# Patient Record
Sex: Male | Born: 1965
Health system: Southern US, Community
[De-identification: ages and names within clinical notes are randomized; demographics above are authoritative.]

## PROBLEM LIST (undated history)

## (undated) ENCOUNTER — Emergency Department (HOSPITAL_COMMUNITY): Discharge status: Absent without leave | Payer: Self-pay | Source: Home / Self Care

## (undated) DIAGNOSIS — E7800 Pure hypercholesterolemia, unspecified: Secondary | ICD-10-CM

## (undated) DIAGNOSIS — T7840XA Allergy, unspecified, initial encounter: Secondary | ICD-10-CM

## (undated) DIAGNOSIS — I10 Essential (primary) hypertension: Secondary | ICD-10-CM

## (undated) DIAGNOSIS — K219 Gastro-esophageal reflux disease without esophagitis: Secondary | ICD-10-CM

## (undated) DIAGNOSIS — E119 Type 2 diabetes mellitus without complications: Secondary | ICD-10-CM

## (undated) HISTORY — DX: Allergy, unspecified, initial encounter: T78.40XA

## (undated) HISTORY — PX: CARDIAC CATHETERIZATION: SHX172

## (undated) HISTORY — DX: Type 2 diabetes mellitus without complications: E11.9

## (undated) HISTORY — DX: Gastro-esophageal reflux disease without esophagitis: K21.9

---

## 2008-07-05 ENCOUNTER — Emergency Department (HOSPITAL_COMMUNITY): Admission: EM | Admit: 2008-07-05 | Discharge: 2008-07-05 | Payer: Self-pay | Admitting: Emergency Medicine

## 2010-11-27 NOTE — Consult Note (Signed)
NAMEUZIEL, COVAULT              ACCOUNT NO.:  [id number]   MEDICAL RECORD NO.:  [id number]          PATIENT TYPE:  EMS   LOCATION:  [facility]                         FACILITY:  [facility]   PHYSICIAN:  Vesta Mixer, M.D. DATE OF BIRTH:  [birth year]   DATE OF CONSULTATION:  07/05/2008  DATE OF DISCHARGE:                                 CONSULTATION   Stephen Lucero is a 45 year old gentleman with a history of diastolic  heart failure, hypertension, and chest pains.  He presented to the  emergency room on July 05, 2008.  He was seen by one of the  emergency room staff, Dr. Rolm Baptise, who is a resident doctor.  We  were called and asked to see Stephen Lucero for further evaluation.   Stephen Lucero is a middle-aged gentleman with a history of hypertension.  I  have seen him in my office previously.  He has had some chest heaviness  and shortness of breath.  He has had chest pains off and on for years.  I saw him this past July.  He has had an echocardiogram which reveals  normal left ventricular systolic function.  He does have marked left  ventricular hypertrophy with evidence of diastolic dysfunction.  He has  had some episodes of chest discomfort for the past several months.  These episodes occur at various times.  They are not associated with  eating, drinking, change of position, or taking a deep breath.  He  occasionally has some, with exertion, especially if he is carrying a  heavy object.  He presented to the hospital today with episodes of chest  pain that have been going on off and on for many days.  He thinks that  they have just gotten worse through the day.  He has not had any chest  pain since he was originally evaluated here in the ER at 1 o'clock.  He  denies any syncope or presyncope.  He denies any PND or orthopnea.  He  denies any nausea or vomiting.  He has had some diaphoresis but not  associated with the chest pain.  All of his review of systems were  reviewed and are  otherwise negative.   CURRENT MEDICATIONS:  1. Carvedilol 6.25 mg twice a day.  2. Lisinopril 10 mg a day.  3. Hydrochlorothiazide 25 mg a day.  4. Potassium chloride 10 mEq a day.   He has no known drug allergies.   PAST MEDICAL HISTORY:  1. Hypertension.  2. Diastolic dysfunction.  3. History of chest pain.   SOCIAL HISTORY:  The patient does not smoke.  He drinks approximately 8-  12 beers each night.  The patient operates [employer].   FAMILY HISTORY:  Positive for cardiac disease.   REVIEW OF SYSTEMS:  As noted in the HPI.  All other systems were  reviewed and are negative.   PHYSICAL EXAMINATION:  GENERAL:  He is a middle-aged gentleman in no  acute distress.  He is currently pain free and appears very comfortable.  VITAL SIGNS: His blood pressure is 140/104 and his heart rate is  70.  HEENT:  He is normocephalic and atraumatic.  NECK:  Supple.  Carotids are 2+ without bruits.  There is no JVD.  No  thyromegaly.  LUNGS:  Clear.  BACK:  Nontender.  HEART:  Regular rate.  S1 and S2.  He has a soft systolic murmur.  ABDOMEN:  Good bowel sounds and is nontender.  EXTREMITIES:  There is no clubbing, cyanosis, or edema.  His pulses are  intact.  SKIN:  Warm and dry.  NEUROLOGIC:  Cranial nerves II through XII are intact.  His motor and  sensory functions are intact.  His gait is normal.   LABORATORY DATA:  His CPK-MB and troponin levels were normal.  His basic  laboratory data was normal.  His sodium is 136, potassium is 3.9,  chloride is 103, BUN is 12, and creatinine is 1.  His white blood cell  count is 8.4, hemoglobin is 16.1, and hematocrit is 47.9.   Stephen Lucero presents with some chest tightness has been occurring off and  on for the past several months.  He thinks it may be a little bit worse  now.  I do not get the idea that he has had acute worsening today.  He  is completely stable.  His EKG today reveals normal sinus rhythm with no  ST or T-wave  changes.  He is stable to leave the emergency room.  We  will double his carvedilol to 12.5 mg twice a day.  I have given him a  prescription for nitroglycerin to use as needed.  We will continue with  the hydrochlorothiazide, lisinopril, and potassium chloride.  I will see  him back in the office in a couple of weeks and we will arrange for  stress Cardiolite study at that time.  He has been instructed to call  the ER right away if he has any recurrent episodes of chest pain.   I have recommended that he greatly diminish his alcohol intake.  I have  suggested that he cut his alcohol intake to take no more than 6 beers a  night for now.  We will cut it further at a later time.  I have also  asked him to greatly diminish his salt intake, he eats a lot of fast  food and restriction of salt certainly will help his blood pressure.      Vesta Mixer, M.D.  Electronically Signed     PJN/MEDQ  D:  07/05/2008  T:  07/06/2008  Job:  [id number]   cc:   Evelena Peat, M.D.

## 2011-04-19 LAB — POCT I-STAT, CHEM 8
Calcium, Ion: 1.05 mmol/L — ABNORMAL LOW (ref 1.12–1.32)
Creatinine, Ser: 1.1 mg/dL (ref 0.4–1.5)
Glucose, Bld: 100 mg/dL — ABNORMAL HIGH (ref 70–99)
HCT: 49 % (ref 39.0–52.0)
Hemoglobin: 16.7 g/dL (ref 13.0–17.0)
Potassium: 3.9 mEq/L (ref 3.5–5.1)
Sodium: 136 mEq/L (ref 135–145)
TCO2: 27 mmol/L (ref 0–100)

## 2011-04-19 LAB — CBC
Platelets: 244 10*3/uL (ref 150–400)
WBC: 8.4 10*3/uL (ref 4.0–10.5)

## 2011-04-19 LAB — RAPID URINE DRUG SCREEN, HOSP PERFORMED
Benzodiazepines: POSITIVE — AB
Cocaine: NOT DETECTED
Opiates: NOT DETECTED
Tetrahydrocannabinol: NOT DETECTED

## 2011-04-19 LAB — BASIC METABOLIC PANEL
BUN: 10 mg/dL (ref 6–23)
Creatinine, Ser: 0.89 mg/dL (ref 0.4–1.5)
GFR calc Af Amer: >60 mL/min (ref >60)
GFR calc non Af Amer: >60 mL/min (ref >60)
Glucose, Bld: 99 mg/dL (ref 70–99)

## 2012-04-28 ENCOUNTER — Emergency Department (HOSPITAL_COMMUNITY): Payer: BC Managed Care – PPO

## 2012-04-28 ENCOUNTER — Encounter (HOSPITAL_COMMUNITY): Payer: Self-pay | Admitting: Emergency Medicine

## 2012-04-28 ENCOUNTER — Other Ambulatory Visit: Payer: Self-pay

## 2012-04-28 ENCOUNTER — Emergency Department (HOSPITAL_COMMUNITY)
Admission: EM | Admit: 2012-04-28 | Discharge: 2012-04-28 | Disposition: A | Discharge status: Home / Self Care | Payer: BC Managed Care – PPO | Attending: Emergency Medicine | Admitting: Emergency Medicine

## 2012-04-28 DIAGNOSIS — R42 Dizziness and giddiness: Secondary | ICD-10-CM | POA: Insufficient documentation

## 2012-04-28 DIAGNOSIS — I10 Essential (primary) hypertension: Secondary | ICD-10-CM | POA: Insufficient documentation

## 2012-04-28 DIAGNOSIS — R110 Nausea: Secondary | ICD-10-CM | POA: Insufficient documentation

## 2012-04-28 DIAGNOSIS — R0602 Shortness of breath: Secondary | ICD-10-CM | POA: Insufficient documentation

## 2012-04-28 DIAGNOSIS — R079 Chest pain, unspecified: Secondary | ICD-10-CM | POA: Insufficient documentation

## 2012-04-28 HISTORY — DX: Pure hypercholesterolemia, unspecified: E78.00

## 2012-04-28 HISTORY — DX: Essential (primary) hypertension: I10

## 2012-04-28 LAB — CBC WITH DIFFERENTIAL/PLATELET
Basophils Absolute: 0 10*3/uL (ref 0.0–0.1)
Basophils Relative: 0 % (ref 0–1)
Eosinophils Relative: 5 % (ref 0–5)
HCT: 45.7 % (ref 39.0–52.0)
Lymphocytes Relative: 20 % (ref 12–46)
MCHC: 35.7 g/dL (ref 30.0–36.0)
Monocytes Absolute: 0.6 10*3/uL (ref 0.1–1.0)
Neutro Abs: 5.5 10*3/uL (ref 1.7–7.7)
Platelets: 210 10*3/uL (ref 150–400)
RDW: 12.3 % (ref 11.5–15.5)
WBC: 8 10*3/uL (ref 4.0–10.5)

## 2012-04-28 LAB — BASIC METABOLIC PANEL
CO2: 32 mEq/L (ref 19–32)
Calcium: 10.5 mg/dL (ref 8.4–10.5)
Chloride: 97 mEq/L (ref 96–112)
Creatinine, Ser: 0.85 mg/dL (ref 0.50–1.35)
GFR calc Af Amer: >90 mL/min (ref >90)
Sodium: 136 mEq/L (ref 135–145)

## 2012-04-28 LAB — POCT I-STAT TROPONIN I: Troponin i, poc: 0 ng/mL (ref 0.00–0.08)

## 2012-04-28 MED ORDER — HYDROCHLOROTHIAZIDE 25 MG PO TABS
25.0000 mg | ORAL_TABLET | Freq: Once | ORAL | Status: AC
  Administered 2012-04-28: 25 mg via ORAL
  Filled 2012-04-28: qty 1

## 2012-04-28 MED ORDER — HYDROCHLOROTHIAZIDE 25 MG PO TABS: 25.0000 mg | ORAL_TABLET | Freq: Every day | ORAL | Status: DC

## 2012-04-28 MED ORDER — OMEPRAZOLE 20 MG PO CPDR: 20.0000 mg | DELAYED_RELEASE_CAPSULE | Freq: Every day | ORAL | Status: DC

## 2012-04-28 MED ORDER — FAMOTIDINE 20 MG PO TABS: 20.0000 mg | ORAL_TABLET | Freq: Two times a day (BID) | ORAL | Status: DC

## 2012-04-28 MED ORDER — LISINOPRIL 20 MG PO TABS
20.0000 mg | ORAL_TABLET | Freq: Once | ORAL | Status: AC
  Administered 2012-04-28: 20 mg via ORAL
  Filled 2012-04-28: qty 1

## 2012-04-28 MED ORDER — LISINOPRIL 20 MG PO TABS: 20.0000 mg | ORAL_TABLET | Freq: Every day | ORAL | Status: DC

## 2012-04-28 NOTE — ED Notes (Signed)
Re faxed request to [facility] for Medical Records.

## 2012-04-28 NOTE — ED Notes (Signed)
Per EMS: Pt c/o chest pain for past two weeks.  Began to get worse last night and called EMS this am.  BP 200/120, EMS gave ASA and 3 Nitro.    Per Pt: c/o chest pain for past 2 weeks,  Yesterday it became worse and he spoke to a [facility] physician who suggested he come to the ED for evaluation.  He states that this pain has been occuring on and over for the past few years.  States that at times he feels SOB with diaphoresis but denies N/V or dizziness.  The pain is located in the sternal region of the chest and pt denies any radiating pain. Nad noted at this time.

## 2012-04-28 NOTE — ED Notes (Signed)
Pt states that he has not been able to take his HTN medication for over a year.

## 2012-04-28 NOTE — ED Notes (Signed)
First contact with pt. Pt denies any complaints at this time

## 2012-04-28 NOTE — ED Provider Notes (Signed)
History     CSN: [id number]  Arrival date & time 04/28/12  0910   First MD Initiated Contact with Patient 04/28/12 [phone]      Chief Complaint  Patient presents with  . Chest Pain    (Consider location/radiation/quality/duration/timing/severity/associated sxs/prior treatment) HPI Comments: Stephen Lucero is a 46 y.o. Male who presents with complaint of chest pain. Pt states pain has been on and off for 6 hrs. States it comes and goes, nothing makes it better or worse. Last episode woke him up from sleep at 3am. States improved with asa and nitro. Pt states he was admitted 1ya at [facility], states that he had a stress test that was normal, and had cardiac cath which he also states did not show any abnormalities. Pt has been following up with PCP in [place], but states no one has been able to figure out why he is having this pain. Pt denies reflux like symptoms, denies cough, denies URI symptoms. Pain is pressure like, not pleuritic. Admits to tingling in hands and nausea when gets pain. States diaphoresis as well, but states at times he has sweats even if he is not having any pain. Stats also having dizziness and "black spots" in eyes at times. This also comes and goes.   The history is provided by the patient.    Past Medical History  Diagnosis Date  . Hypertension   . High cholesterol     Past Surgical History  Procedure Date  . Cardiac catheterization     No family history on file.  History  Substance Use Topics  . Smoking status: Never Smoker   . Smokeless tobacco: Not on file  . Alcohol Use: Yes     Occasionaly on weekends      Review of Systems  Constitutional: Negative for fever and chills.  HENT: Negative for neck pain and neck stiffness.   Eyes: Negative for visual disturbance.  Respiratory: Positive for chest tightness and shortness of breath. Negative for cough and wheezing.   Cardiovascular: Positive for chest pain. Negative for palpitations and leg  swelling.  Gastrointestinal: Positive for nausea. Negative for vomiting and abdominal pain.  Genitourinary: Negative for dysuria.  Musculoskeletal: Negative.   Skin: Negative.   Neurological: Positive for dizziness and light-headedness. Negative for weakness, numbness and headaches.  Hematological: Negative.   Psychiatric/Behavioral: Negative.     Allergies  Review of patient's allergies indicates no known allergies.  Home Medications  No current outpatient prescriptions on file.  BP 167/111  Pulse 77  Temp 98.8 F (37.1 C) (Oral)  Resp 18  SpO2 96%  Physical Exam  Nursing note and vitals reviewed. Constitutional: He appears well-developed and well-nourished. No distress.  HENT:  Head: Normocephalic.  Eyes: Conjunctivae normal are normal.  Neck: Neck supple.  Cardiovascular: Normal rate, regular rhythm and normal heart sounds.     ED Course  Procedures (including critical care time)  PT with chest pain, on and off for 6 years. Pt states negative cath and stress test at [facility] 1  Year ago. Pt is currently cp free. BP elevated, 167/111, pt has been off BP meds for a year because has not followed up. Pt is an alcohol drinker, wonder if could be related to gastritis. Will get labs, ECG, cxr.  Results for orders placed during the hospital encounter of 04/28/12  CBC WITH DIFFERENTIAL      Component Value Range   WBC 8.0  4.0 - 10.5 K/uL   RBC 5.23  4.22 -  5.81 MIL/uL   Hemoglobin 16.3  13.0 - 17.0 g/dL   HCT 62.1  30.8 - 65.7 %   MCV 87.4  78.0 - 100.0 fL   MCH 31.2  26.0 - 34.0 pg   MCHC 35.7  30.0 - 36.0 g/dL   RDW 84.6  96.2 - 95.2 %   Platelets 210  150 - 400 K/uL   Neutrophils Relative 68  43 - 77 %   Neutro Abs 5.5  1.7 - 7.7 K/uL   Lymphocytes Relative 20  12 - 46 %   Lymphs Abs 1.6  0.7 - 4.0 K/uL   Monocytes Relative 7  3 - 12 %   Monocytes Absolute 0.6  0.1 - 1.0 K/uL   Eosinophils Relative 5  0 - 5 %   Eosinophils Absolute 0.4  0.0 - 0.7 K/uL    Basophils Relative 0  0 - 1 %   Basophils Absolute 0.0  0.0 - 0.1 K/uL  BASIC METABOLIC PANEL      Component Value Range   Sodium 136  135 - 145 mEq/L   Potassium 4.1  3.5 - 5.1 mEq/L   Chloride 97  96 - 112 mEq/L   CO2 32  19 - 32 mEq/L   Glucose, Bld 111 (*) 70 - 99 mg/dL   BUN 10  6 - 23 mg/dL   Creatinine, Ser 8.41  0.50 - 1.35 mg/dL   Calcium 32.4  8.4 - 40.1 mg/dL   GFR calc non Af Amer >90  >90 mL/min   GFR calc Af Amer >90  >90 mL/min  POCT I-STAT TROPONIN I      Component Value Range   Troponin i, poc 0.00  0.00 - 0.08 ng/mL   Comment 3           POCT I-STAT TROPONIN I      Component Value Range   Troponin i, poc 0.00  0.00 - 0.08 ng/mL   Comment 3            Dg Chest 2 View  04/28/2012  *RADIOLOGY REPORT*  Clinical Data: Chest pain  CHEST - 2 VIEW  Comparison:  July 05, 2008  Findings:  Lungs clear.  Heart size and pulmonary vascularity are normal.  No adenopathy.  No bone lesions.  IMPRESSION: Lungs clear.   Original Report Authenticated By: Arvin Collard. WOODRUFF III, M.D.     2:56 PM I have requested and reviewed pt's hospitalization records from last year at [facility]. Pt with cardiac cath on 03/21/11 showing Normal EF at 60%, LM nl, LAD prox 50% stenosis, diag 50% stenosis, LCX and RCA normal. Pt's troponins here are negative x2. Discussed with Dr. Preston Fleeting. Pt with stable symptoms, plan to d/c home with follow up with [facility] or cardiologists at [facility]. Pt voiced understanding. Will start on prilosec for possible gastritis.    1. Chest pain       MDM  See above        Lottie Mussel, PA 04/28/12 1549

## 2012-04-28 NOTE — ED Provider Notes (Signed)
Date: 04/28/2012  Rate: 79  Rhythm: normal sinus rhythm  QRS Axis: left  Intervals: normal  ST/T Wave abnormalities: normal  Conduction Disutrbances:none  Narrative Interpretation: Left axis deviation, left atrial hypertrophy. When compared with ECG of 07/05/2008, no significant changes are seen.  Old EKG Reviewed: unchanged  Medical screening examination/treatment/procedure(s) were performed by non-physician practitioner and as supervising physician I was immediately available for consultation/collaboration.    Dione Booze, MD 04/29/12 [phone]

## 2014-05-28 IMAGING — CR DG CHEST 2V
2 series · 2 of 2 positions shown · non-contrast
Comparison: July 05, 2008

CLINICAL DATA: Chest pain

CHEST - 2 VIEW

[w chest pa]
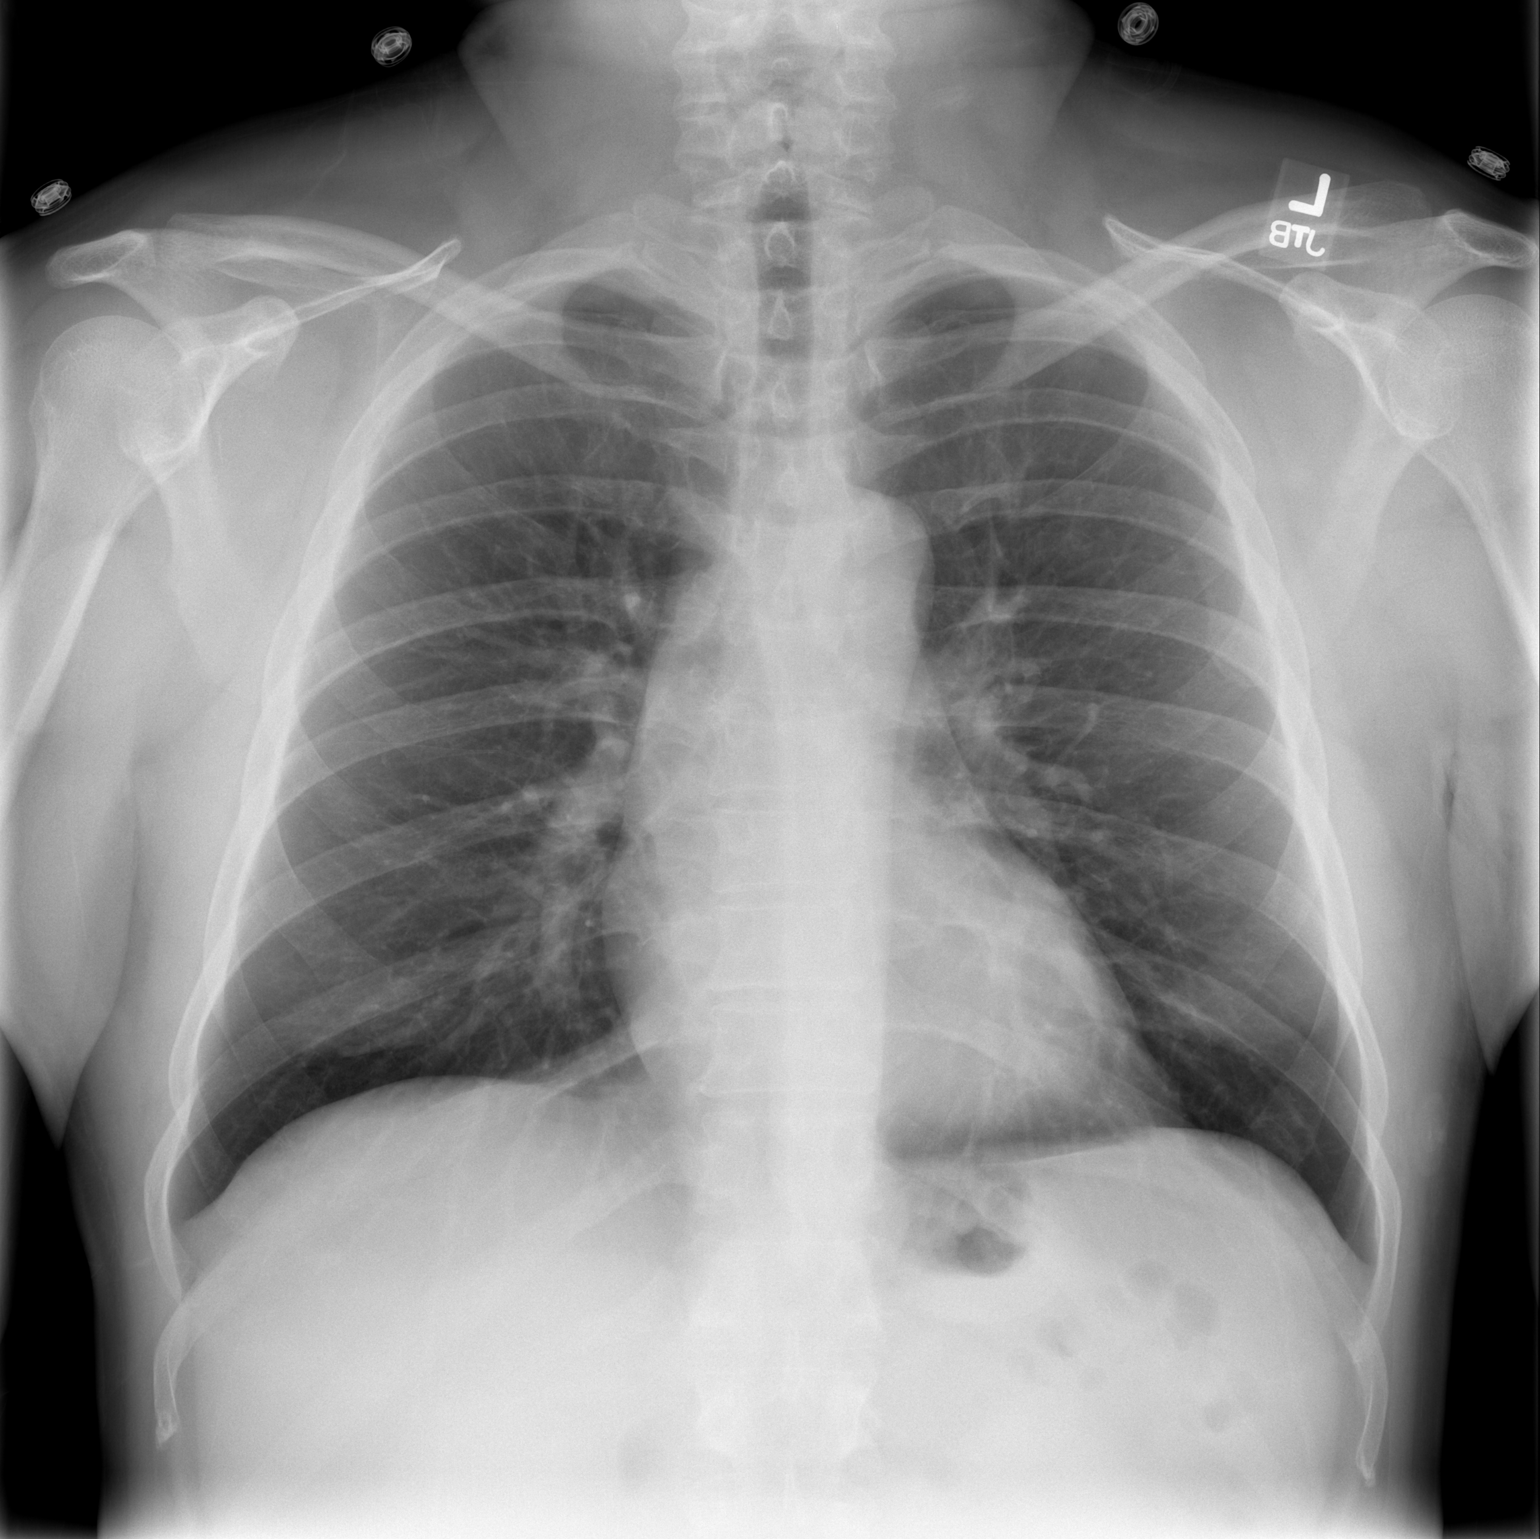

[w chest lat]
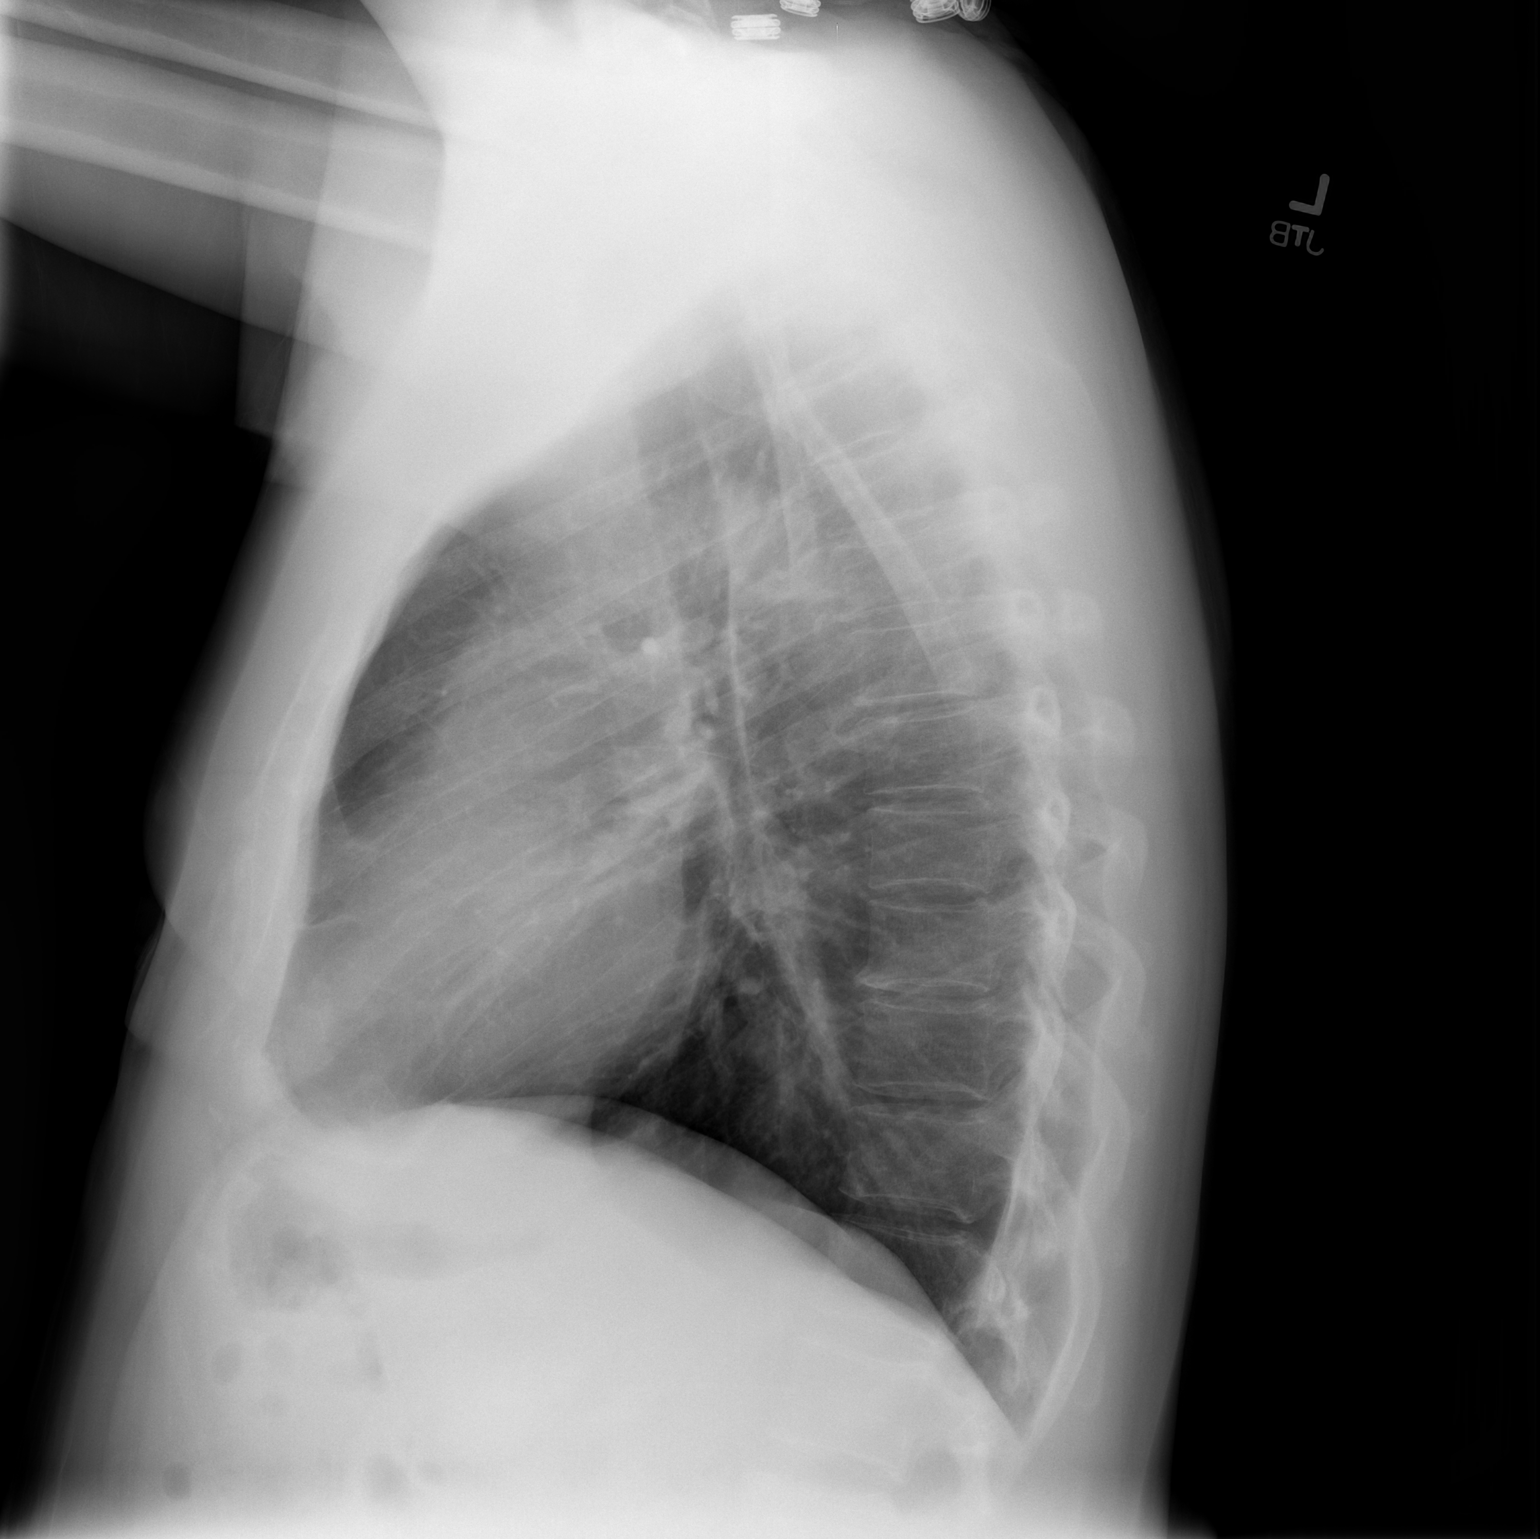

[2 of 2 positions shown; findings below may reference images not displayed]

FINDINGS: Lungs clear.  Heart size and pulmonary vascularity are
normal.  No adenopathy.  No bone lesions.
IMPRESSION: Lungs clear.

## 2015-08-22 ENCOUNTER — Encounter: Payer: Self-pay | Admitting: Family Medicine

## 2015-08-22 ENCOUNTER — Ambulatory Visit (INDEPENDENT_AMBULATORY_CARE_PROVIDER_SITE_OTHER): Payer: BLUE CROSS/BLUE SHIELD | Admitting: Family Medicine

## 2015-08-22 VITALS — BP 179/100 | HR 78 | Temp 98.5°F | Resp 20 | Ht 69.25 in | Wt 207.5 lb

## 2015-08-22 DIAGNOSIS — Z7189 Other specified counseling: Secondary | ICD-10-CM | POA: Diagnosis not present

## 2015-08-22 DIAGNOSIS — Z7689 Persons encountering health services in other specified circumstances: Secondary | ICD-10-CM

## 2015-08-22 DIAGNOSIS — Z0001 Encounter for general adult medical examination with abnormal findings: Secondary | ICD-10-CM | POA: Diagnosis not present

## 2015-08-22 DIAGNOSIS — R002 Palpitations: Secondary | ICD-10-CM | POA: Diagnosis not present

## 2015-08-22 DIAGNOSIS — I10 Essential (primary) hypertension: Secondary | ICD-10-CM

## 2015-08-22 DIAGNOSIS — R6889 Other general symptoms and signs: Secondary | ICD-10-CM

## 2015-08-22 LAB — CBC WITH DIFFERENTIAL/PLATELET
BASOS ABS: 0 10*3/uL (ref 0.0–0.1)
BASOS PCT: 0 % (ref 0–1)
Eosinophils Absolute: 0.2 10*3/uL (ref 0.0–0.7)
Eosinophils Relative: 3 % (ref 0–5)
HCT: 44.5 % (ref 39.0–52.0)
HEMOGLOBIN: 15.3 g/dL (ref 13.0–17.0)
Lymphocytes Relative: 32 % (ref 12–46)
Lymphs Abs: 2.5 10*3/uL (ref 0.7–4.0)
MCH: 30.1 pg (ref 26.0–34.0)
MCHC: 34.4 g/dL (ref 30.0–36.0)
MCV: 87.4 fL (ref 78.0–100.0)
MPV: 9.4 fL (ref 8.6–12.4)
Monocytes Absolute: 0.5 10*3/uL (ref 0.1–1.0)
Monocytes Relative: 7 % (ref 3–12)
NEUTROS ABS: 4.5 10*3/uL (ref 1.7–7.7)
NEUTROS PCT: 58 % (ref 43–77)
Platelets: 221 10*3/uL (ref 150–400)
RBC: 5.09 MIL/uL (ref 4.22–5.81)
RDW: 13 % (ref 11.5–15.5)
WBC: 7.8 10*3/uL (ref 4.0–10.5)

## 2015-08-22 LAB — COMPREHENSIVE METABOLIC PANEL
ALBUMIN: 4.3 g/dL (ref 3.6–5.1)
ALT: 33 U/L (ref 9–46)
AST: 18 U/L (ref 10–35)
Alkaline Phosphatase: 59 U/L (ref 40–115)
BILIRUBIN TOTAL: 0.4 mg/dL (ref 0.2–1.2)
BUN: 16 mg/dL (ref 7–25)
CO2: 29 mmol/L (ref 20–31)
CREATININE: 1.09 mg/dL (ref 0.70–1.33)
Calcium: 9.3 mg/dL (ref 8.6–10.3)
Chloride: 99 mmol/L (ref 98–110)
Glucose, Bld: 154 mg/dL — ABNORMAL HIGH (ref 65–99)
Potassium: 4.1 mmol/L (ref 3.5–5.3)
SODIUM: 138 mmol/L (ref 135–146)
TOTAL PROTEIN: 7.3 g/dL (ref 6.1–8.1)

## 2015-08-22 MED ORDER — LISINOPRIL 20 MG PO TABS: 20.0000 mg | ORAL_TABLET | Freq: Every day | ORAL | Status: DC

## 2015-08-22 NOTE — Progress Notes (Signed)
Patient ID: Stephen Lucero, male   DOB: 08/04/1965, 50 y.o.   MRN: [id number]      Patient ID: Stephen Lucero, male  DOB: 03/07/1966, 32 y.o.   MRN: [id number]  Subjective:  Stephen Lucero is a 50 y.o. male present for annual exam All past medical history, surgical history, allergies, family history, immunizations, medications and social history were obtained and entered into the electronic medical record today. All recent labs, ED visits and hospitalizations within the last year were reviewed.  Hypertension: Patient reports he has a history of high blood pressure. He was medicated for this in the past, but has not been on any medications for a couple of years. Patient states he has had a cardiac catheterization in the past, denies MI. He states his cardiac catheterization was normal. Patient states he has noticed increased fatigue over the last few weeks. He denies any fevers, chills, nausea, vomit or diarrhea. He states sometimes eating helps decrease his fatigue. She denies any chest pain, lower extremity edema, orthopnea or syncope. He endorses palpitations (see a couple times last week), occasional dizziness and occasional shortness of breath. He states he is active at work, he does not seem to feel his shortness of breath is associated with any particular activity, including rest or exertion.   Health maintenance:  Colonoscopy: Patient has never had a colonoscopy, he has 2 maternal aunts with a history of cancer of unknown type.  Infectious disease screening: Never. PSA: Never.  Past Medical History  Diagnosis Date  . Hypertension   . High cholesterol   . Allergy   . GERD (gastroesophageal reflux disease)    No Known Allergies Past Surgical History  Procedure Laterality Date  . Cardiac catheterization     Family History  Problem Relation Age of Onset  . Alcohol abuse Father   . Cirrhosis Father   . Early death Father 69    cirrhosis   . Cancer Maternal Aunt   . Heart disease  Paternal Grandfather   . Hyperlipidemia Paternal Grandfather   . Hypertension Paternal Grandfather    Social History   Social History  . Marital Status: Single    Spouse Name: N/A  . Number of Children: N/A  . Years of Education: N/A   Occupational History  . Not on file.   Social History Main Topics  . Smoking status: Never Smoker   . Smokeless tobacco: Never Used  . Alcohol Use: 7.2 oz/week    12 Cans of beer per week     Comment: Occasionaly on weekends  . Drug Use: No  . Sexual Activity: Yes   Other Topics Concern  . Not on file   Social History Narrative   Single. No children.   11th grade education.    Wears seatbelt   Regular diet.    Smoke detector in the home.    Feels safe in relationship.    No firearms in the home.     ROS: Negative, with the exception of above mentioned in HPI  Objective: BP 179/100 mmHg  Pulse 78  Temp(Src) 98.5 F (36.9 C)  Resp 20  Ht 5' 9.25" (1.759 m)  Wt 207 lb 8 oz (94.121 kg)  BMI 30.42 kg/m2  SpO2 97% Gen: Afebrile. No acute distress. Nontoxic in appearance, well-developed, well-nourished, obese, Caucasian male HENT: AT. [place]. Bilateral TM unable to be visualized secondary to cerumen impaction. MMM, no oral lesions, poor dentition. Bilateral nares without erythema or swelling. Throat without erythema, ulcerations or exudates.  No Cough on exam, no hoarseness on exam. Eyes: Mild exophthalmus?. Pupils Equal Round Reactive to light, Extraocular movements intact,  Conjunctiva without redness, discharge or icterus. Neck/lymp/endocrine: Supple, no lymphadenopathy, no thyromegaly CV: RRR no m/c/g/r, no edema, +2/4 P posterior tibialis pulses. . Chest: CTAB, no wheeze, rhonchi or crackles. Normal Respiratory effort. No Air movement. Abd: Soft. Round. NTND. BS present. No Masses palpated. No hepatosplenomegaly. No rebound tenderness or guarding. Skin: Facial flush, No rashes, purpura or petechiae. Warm and well-perfused. Skin  intact. Neuro/Msk:  Normal gait. PERLA. EOMi. Alert. Oriented x3.   Psych: Normal affect, dress and demeanor. Normal speech. Normal thought content and judgment. EKG: Sinus rhythm. Left atrial enlargement. Nonspecific T wave changes, early repolarization. Heart rate 76. PR 182. QTC 420.  Unchanged from prior EKGs.  Assessment/plan: Stephen Lucero is a 50 y.o. male present for establishment of care with abnormal findings on annual exam.  Heart palpitations - TSH, concern for possible thyroid disorder. - EKG 12-Lead--> unchanged from prior EKG.  - CBC w/Diff  Encounter for routine adult medical exam with abnormal findings - elevated BP, increased x2. Second: 198/130. Symptomatically. Please see below.  Essential hypertension, benign - Patient encouraged take baby aspirin daily, maintain a low-salt diet. - restart lisinopril 20 mg QD, re-eval in 1 week with rpt BMP - Comp MET (CMET) - HgB A1c - TSH - Lipid panel; Future - Discussed in detail with pt, his BP is extremely elevated and likely has been this way for some time. If he experiences any chest pain, dizziness, syncope, aphasia, TIA/CVA symptoms reviewed with pt.  He is to go to ED immediately.  - consider ECHO and cardio eval once blood pressure controlled - AVS education on CVA/TIA and hypertension was provided to patient today. - One-week follow-up with BMP  Health maintenance: We'll discuss/offer screenings next office visit. Colonoscopy: Patient has never had a colonoscopy, he has 2 maternal aunts with a history of cancer of unknown type.  Infectious disease screening: Never. Immunizations: Unknown. PSA: Never.  Patient was encouraged to exercise greater than 150 minutes a week. Patient was encouraged to choose a diet filled with fresh fruits and vegetables, and lean meats. AVS provided to patient today for education/recommendation on gender specific health and safety maintenance. Return in about 1 week (around 08/29/2015),  or Hypertension, for Office visit.    Greater than 45 minutes was spent with patient, greater than 50% of that time was spent face-to-face with patient counseling and coordinating care.    Howard Pouch, DO [place]

## 2015-08-22 NOTE — Patient Instructions (Signed)
Hypertension Hypertension, commonly called high blood pressure, is when the force of blood pumping through your arteries is too strong. Your arteries are the blood vessels that carry blood from your heart throughout your body. A blood pressure reading consists of a higher number over a lower number, such as 110/72. The higher number (systolic) is the pressure inside your arteries when your heart pumps. The lower number (diastolic) is the pressure inside your arteries when your heart relaxes. Ideally you want your blood pressure below 120/80. Hypertension forces your heart to work harder to pump blood. Your arteries may become narrow or stiff. Having untreated or uncontrolled hypertension can cause heart attack, stroke, kidney disease, and other problems. RISK FACTORS Some risk factors for high blood pressure are controllable. Others are not.  Risk factors you cannot control include:   Race. You may be at higher risk if you are African American.  Age. Risk increases with age.  Gender. Men are at higher risk than women before age 45 years. After age 65, women are at higher risk than men. Risk factors you can control include:  Not getting enough exercise or physical activity.  Being overweight.  Getting too much fat, sugar, calories, or salt in your diet.  Drinking too much alcohol. SIGNS AND SYMPTOMS Hypertension does not usually cause signs or symptoms. Extremely high blood pressure (hypertensive crisis) may cause headache, anxiety, shortness of breath, and nosebleed. DIAGNOSIS To check if you have hypertension, your health care provider will measure your blood pressure while you are seated, with your arm held at the level of your heart. It should be measured at least twice using the same arm. Certain conditions can cause a difference in blood pressure between your right and left arms. A blood pressure reading that is higher than normal on one occasion does not mean that you need treatment. If  it is not clear whether you have high blood pressure, you may be asked to return on a different day to have your blood pressure checked again. Or, you may be asked to monitor your blood pressure at home for 1 or more weeks. TREATMENT Treating high blood pressure includes making lifestyle changes and possibly taking medicine. Living a healthy lifestyle can help lower high blood pressure. You may need to change some of your habits. Lifestyle changes may include:  Following the DASH diet. This diet is high in fruits, vegetables, and whole grains. It is low in salt, red meat, and added sugars.  Keep your sodium intake below 2,300 mg per day.  Getting at least 30-45 minutes of aerobic exercise at least 4 times per week.  Losing weight if necessary.  Not smoking.  Limiting alcoholic beverages.  Learning ways to reduce stress. Your health care provider may prescribe medicine if lifestyle changes are not enough to get your blood pressure under control, and if one of the following is true:  You are 18-59 years of age and your systolic blood pressure is above 140.  You are 60 years of age or older, and your systolic blood pressure is above 150.  Your diastolic blood pressure is above 90.  You have diabetes, and your systolic blood pressure is over 140 or your diastolic blood pressure is over 90.  You have kidney disease and your blood pressure is above 140/90.  You have heart disease and your blood pressure is above 140/90. Your personal target blood pressure may vary depending on your medical conditions, your age, and other factors. HOME CARE INSTRUCTIONS    Have your blood pressure rechecked as directed by your health care provider.   Take medicines only as directed by your health care provider. Follow the directions carefully. Blood pressure medicines must be taken as prescribed. The medicine does not work as well when you skip doses. Skipping doses also puts you at risk for  problems.  Do not smoke.   Monitor your blood pressure at home as directed by your health care provider. SEEK MEDICAL CARE IF:   You think you are having a reaction to medicines taken.  You have recurrent headaches or feel dizzy.  You have swelling in your ankles.  You have trouble with your vision. SEEK IMMEDIATE MEDICAL CARE IF:  You develop a severe headache or confusion.  You have unusual weakness, numbness, or feel faint.  You have severe chest or abdominal pain.  You vomit repeatedly.  You have trouble breathing. MAKE SURE YOU:   Understand these instructions.  Will watch your condition.  Will get help right away if you are not doing well or get worse.   This information is not intended to replace advice given to you by your health care provider. Make sure you discuss any questions you have with your health care provider.   Document Released: 07/01/2005 Document Revised: 11/15/2014 Document Reviewed: 04/23/2013 Elsevier Interactive Patient Education 2016 [employer].   Stroke Prevention Some health problems and behaviors may make it more likely for you to have a stroke. Below are ways to lessen your risk of having a stroke.   Be active for at least 30 minutes on most or all days.  Do not smoke. Try not to be around others who smoke.  Do not drink too much alcohol.  Do not have more than 2 drinks a day if you are a man.  Do not have more than 1 drink a day if you are a woman and are not pregnant.  Eat healthy foods, such as fruits and vegetables. If you were put on a specific diet, follow the diet as told.  Keep your cholesterol levels under control through diet and medicines. Look for foods that are low in saturated fat, trans fat, cholesterol, and are high in fiber.  If you have diabetes, follow all diet plans and take your medicine as told.  Ask your doctor if you need treatment to lower your blood pressure. If you have high blood pressure  (hypertension), follow all diet plans and take your medicine as told by your doctor.  If you are 33-70 years old, have your blood pressure checked every 3-5 years. If you are age 69 or older, have your blood pressure checked every year.  Keep a healthy weight. Eat foods that are low in calories, salt, saturated fat, trans fat, and cholesterol.  Do not take drugs.  Avoid birth control pills, if this applies. Talk to your doctor about the risks of taking birth control pills.  Talk to your doctor if you have sleep problems (sleep apnea).  Take all medicine as told by your doctor.  You may be told to take aspirin or blood thinner medicine. Take this medicine as told by your doctor.  Understand your medicine instructions.  Make sure any other conditions you have are being taken care of. GET HELP RIGHT AWAY IF:  You suddenly lose feeling (you feel numb) or have weakness in your face, arm, or leg.  Your face or eyelid hangs down to one side.  You suddenly feel confused.  You have trouble  talking (aphasia) or understanding what people are saying.  You suddenly have trouble seeing in one or both eyes.  You suddenly have trouble walking.  You are dizzy.  You lose your balance or your movements are clumsy (uncoordinated).  You suddenly have a very bad headache and you do not know the cause.  You have new chest pain.  Your heart feels like it is fluttering or skipping a beat (irregular heartbeat). Do not wait to see if the symptoms above go away. Get help right away. Call your local emergency services (911 in [place] Do not drive yourself to the hospital.   This information is not intended to replace advice given to you by your health care provider. Make sure you discuss any questions you have with your health care provider.   Document Released: 12/31/2011 Document Revised: 07/22/2014 Document Reviewed: 01/01/2013 Elsevier Interactive Patient Education 2016 [employer].  Follow  up 1 week with provider

## 2015-08-23 ENCOUNTER — Telehealth: Payer: Self-pay | Admitting: Family Medicine

## 2015-08-23 DIAGNOSIS — E119 Type 2 diabetes mellitus without complications: Secondary | ICD-10-CM

## 2015-08-23 LAB — HEMOGLOBIN A1C
Hgb A1c MFr Bld: 8.7 % — ABNORMAL HIGH (ref <5.7)
MEAN PLASMA GLUCOSE: 203 mg/dL — AB (ref <117)

## 2015-08-23 LAB — TSH: TSH: 1.34 mIU/L (ref 0.40–4.50)

## 2015-08-23 MED ORDER — METFORMIN HCL ER 500 MG PO TB24: ORAL_TABLET | ORAL | Status: DC

## 2015-08-23 NOTE — Telephone Encounter (Signed)
Please call pt: - His a1c is 8.7 (normal (<5.5). This makes him a diabetic. This is new onset and will  Need to be discussed in detail. We can do so on his follow-up appt next week. Please make certain he has made this for follow up on his hypertension and now discussion on diabetes. - I have called in a medicine for him to start for his Diabetes. It is called metformin, some ppl experience GI discomfort initially (some do  Not), this usually passes after a few weeks of use. We will start low dose and taper up. Full discussion on follow up, including lab results.

## 2015-08-24 NOTE — Telephone Encounter (Signed)
Have made numerous attempts to contact patient without success. Dr Claiborne Billings aware will continue to try to reach patient.

## 2015-08-25 NOTE — Telephone Encounter (Signed)
Bjorn Loser called back and stated that she doesn't know why patient put her as emergency contact because they have not been in contact for several years and she doesn't even know his current phone number.

## 2015-08-25 NOTE — Telephone Encounter (Signed)
Left message with emergency contact Alda Lea to have patient call us.

## 2015-08-28 ENCOUNTER — Encounter: Payer: Self-pay | Admitting: *Deleted

## 2015-08-28 NOTE — Telephone Encounter (Signed)
Mailed letter to patient current known address to contact office.

## 2015-09-01 ENCOUNTER — Encounter: Payer: Self-pay | Admitting: Family Medicine

## 2015-09-01 ENCOUNTER — Ambulatory Visit (INDEPENDENT_AMBULATORY_CARE_PROVIDER_SITE_OTHER): Payer: BLUE CROSS/BLUE SHIELD | Admitting: Family Medicine

## 2015-09-01 ENCOUNTER — Other Ambulatory Visit (INDEPENDENT_AMBULATORY_CARE_PROVIDER_SITE_OTHER): Payer: BLUE CROSS/BLUE SHIELD

## 2015-09-01 DIAGNOSIS — I10 Essential (primary) hypertension: Secondary | ICD-10-CM

## 2015-09-01 DIAGNOSIS — E119 Type 2 diabetes mellitus without complications: Secondary | ICD-10-CM | POA: Diagnosis not present

## 2015-09-01 NOTE — Progress Notes (Signed)
Patient ID: Stephen Lucero, male   DOB: 1966-03-07, 50 y.o.   MRN: [id number] Stephen Lucero is a 50 y.o. male personally present for lab appointment today. However patient labs last week came back abnormal and we have been unsuccessful in reaching him via telephone. Patient is a new diabetic with an elevated A1c of 8.7. This was reviewed with him in person today by this provider. It was discussed that this is diabetes range, and close to uncontrolled diabetes and needing insulin therapy. Patient was counseled on starting metformin, low carbohydrate sugar diet. He was also re-counseled on his blood pressure as he was diagnosed with hypertension on our last visit, and started on medications, which he states he has started but not taken today. His blood pressure is again elevated today in the office. Discussed referral to nutrition/diabetes education, patient is amendable to having referral placed today. He is going to need education surrounding his treatments. Patient has not been to the doctor in over 10 years. Patient needs to follow-up with one week with provider appointment to follow-up on his blood pressure and diabetes. BP 177/118 mmHg  Pulse 78  Resp 20  SpO2 97%

## 2015-09-08 ENCOUNTER — Encounter: Payer: Self-pay | Admitting: Family Medicine

## 2015-09-08 ENCOUNTER — Ambulatory Visit (INDEPENDENT_AMBULATORY_CARE_PROVIDER_SITE_OTHER): Payer: BLUE CROSS/BLUE SHIELD | Admitting: Family Medicine

## 2015-09-08 VITALS — BP 163/90 | HR 67 | Temp 98.4°F | Resp 20 | Wt 206.0 lb

## 2015-09-08 DIAGNOSIS — Z789 Other specified health status: Secondary | ICD-10-CM

## 2015-09-08 DIAGNOSIS — I10 Essential (primary) hypertension: Secondary | ICD-10-CM | POA: Diagnosis not present

## 2015-09-08 DIAGNOSIS — E119 Type 2 diabetes mellitus without complications: Secondary | ICD-10-CM | POA: Diagnosis not present

## 2015-09-08 MED ORDER — LISINOPRIL-HYDROCHLOROTHIAZIDE 20-25 MG PO TABS: 1.0000 | ORAL_TABLET | Freq: Every day | ORAL | Status: DC

## 2015-09-08 MED ORDER — BD ASSURE BPM/AUTO ARM CUFF MISC: Status: DC

## 2015-09-08 NOTE — Progress Notes (Signed)
Patient ID: Stephen Lucero, male   DOB: [birth year], 50 y.o.   MRN: [id number]    Montee Lucero , Sep 26, 1965, 50 y.o., male MRN: [id number]  CC: Diabetes and hypertension follow-up Subjective:   New onset diabetes: Patient presents after initial diagnosis of diabetes type 2. He started on metformin now taking 1000 mg daily. Patient states he is experiencing some GI upset since increasing his dose to 1000 mg. He is on a 24-hour capsules. He is starting to watch his diet, but is unable to attend the nutrition classes. He has questions today surrounding what types of foods he should avoid and what type of foods he should eat. He does not desire to check his blood sugars daily as of yet. He denies any numbness or tingling in his extremities are nonhealing wounds. He denies any hyper or hypoglycemic events.  New onset hypertension: Patient reports compliance with lisinopril daily, he states that he is experiencing some fatigue since starting his medications. He reports compliance with the lisinopril. He does not check his blood pressures in the outpatient setting. He has not started an exercise program as of yet. He does try to eat lower sodium meals. He denies any chest pain, shortness of breath, lower extremity edema or syncope.  Health maintenance: Patient is in need of all immunizations and screenings. He again declines tetanus today. He declined a flu shot on last appointment.   No Known Allergies Social History  Substance Use Topics  . Smoking status: Never Smoker   . Smokeless tobacco: Never Used  . Alcohol Use: 7.2 oz/week    12 Cans of beer per week     Comment: Occasionaly on weekends   Past Medical History  Diagnosis Date  . Hypertension   . High cholesterol   . Allergy   . GERD (gastroesophageal reflux disease)   . Diabetes mellitus without complication [facility])    Past Surgical History  Procedure Laterality Date  . Cardiac catheterization     Family History  Problem Relation Age  of Onset  . Alcohol abuse Father   . Cirrhosis Father   . Early death Father 49    cirrhosis   . Cancer Maternal Aunt   . Heart disease Paternal Grandfather   . Hyperlipidemia Paternal Grandfather   . Hypertension Paternal Grandfather      Medication List       This list is accurate as of: 09/08/15  8:10 AM.  Always use your most recent med list.               hydrochlorothiazide 25 MG tablet  Commonly known as:  HYDRODIURIL  Take 1 tablet (25 mg total) by mouth daily.     lisinopril 20 MG tablet  Commonly known as:  PRINIVIL,ZESTRIL  Take 1 tablet (20 mg total) by mouth daily.     metFORMIN 500 MG 24 hr tablet  Commonly known as:  GLUCOPHAGE XR  500 mg QD for 7 days then 1000 mg QD.     omeprazole 20 MG capsule  Commonly known as:  PRILOSEC  Take 1 capsule (20 mg total) by mouth daily.        ROS: Negative, with the exception of above mentioned in HPI  Objective:  BP 163/90 mmHg  Pulse 67  Temp(Src) 98.4 F (36.9 C)  Resp 20  Wt 206 lb (93.441 kg)  SpO2 96% Body mass index is 30.2 kg/(m^2). Gen: Afebrile. No acute distress. Nontoxic in appearance, well-developed, well-nourished, Caucasian male. Flushed face.  HENT: AT. [place]. MMM, no oral lesions.  Eyes:Pupils Equal Round Reactive to light, Extraocular movements intact,  Conjunctiva without redness, discharge or icterus. CV: RRR, no edema Chest: CTAB, no wheeze or crackles. Good air movement, normal resp effort.  Abd: Soft.  NTND. BS present Neuro:Normal gait. PERLA. EOMi. Alert. Oriented x3    Assessment/Plan: [name] is a 50 y.o. male present for follow up OV for  1. New onset type 2 diabetes mellitus ([place]) - Patient will need to follow-up for her A1c before removing after May 8. Discussed today that the metformin is upsetting his stomach, he is to take 500 mg with breakfast, 500 mg with dinner. If he is unable to tolerate metformin, will need to consider Actos. Discussed with patient today in great  detail types of foods to avoid, or eat small quantities. He was unable to schedule appointment with diabetic nutrition class education secondary to timing of the classes/appt and his work schedule. - Patient was educated on diabetes medications, and if not able to decrease his A1c below 7 with oral medications, he will likely at least need a dual medication therapy or even the addition of insulin. Patient does not desire to check his fasting glucoses as of yet, discussed today that if in 3 months his A1c is not controlled, he will at least need to monitor his fasting glucoses in the morning. - Patient was educated on foot care, diet, exercise and yearly need of ophthalmology exams. - Ambulatory referral to Ophthalmology  2. Essential hypertension, benign - Still elevated above goal today. Patient again was counseled on medication use. Low sodium diet. Exercise. Encouraged patient to obtain a automatic blood cardiac monitor, prescription was provided to him today, and monitor his blood pressure daily at least 2 hours after taking his medications. - Educated patient on normal blood pressure being below 140/90. - Refilled lisinopril 20 mg, and added 25 mg of HCTZ. - Patient is to monitor blood pressure, if remains elevated above parameters given he needs to be seen immediately, otherwise he may be seen 3 months. - Blood Pressure Monitoring (B-D ASSURE BPM/AUTO ARM CUFF) MISC; 1 cuff kit (adult large)  Dispense: 1 each; Refill: 0  3. Health maintenance: Discussed with patient we will be offering him immunizations each time he has an appointment in order to attempt to catch him up on immunizations. He declines his tetanus shot today and states he will get it "next time". - Patient is in need of tetanus and pneumonia series.   electronically signed by:  Howard Pouch, DO  [place]

## 2015-09-08 NOTE — Patient Instructions (Addendum)
BP needs to be below 140/90. Take BP at least 2 hours after meds. I have written for a cuff, there are automated cuffs at [facility] and drug stores. Write down BP and bring to next appt.  Watch salt content in foods for your BP. Diabetes: continue metformin, one with breakfast  And one with dinner. If it is still upsetting your stomach in 2 weeks, call in and we will switch medication.  - Watch carbohydrates and sugars. Drink 80 ounces of water (at least) a day.    Diabetes Mellitus and Food It is important for you to manage your blood sugar (glucose) level. Your blood glucose level can be greatly affected by what you eat. Eating healthier foods in the appropriate amounts throughout the day at about the same time each day will help you control your blood glucose level. It can also help slow or prevent worsening of your diabetes mellitus. Healthy eating may even help you improve the level of your blood pressure and reach or maintain a healthy weight.  General recommendations for healthful eating and cooking habits include:  Eating meals and snacks regularly. Avoid going long periods of time without eating to lose weight.  Eating a diet that consists mainly of plant-based foods, such as fruits, vegetables, nuts, legumes, and whole grains.  Using low-heat cooking methods, such as baking, instead of high-heat cooking methods, such as deep frying. Work with your dietitian to make sure you understand how to use the Nutrition Facts information on food labels. HOW CAN FOOD AFFECT ME? Carbohydrates Carbohydrates affect your blood glucose level more than any other type of food. Your dietitian will help you determine how many carbohydrates to eat at each meal and teach you how to count carbohydrates. Counting carbohydrates is important to keep your blood glucose at a healthy level, especially if you are using insulin or taking certain medicines for diabetes mellitus. Alcohol Alcohol can cause sudden decreases  in blood glucose (hypoglycemia), especially if you use insulin or take certain medicines for diabetes mellitus. Hypoglycemia can be a life-threatening condition. Symptoms of hypoglycemia (sleepiness, dizziness, and disorientation) are similar to symptoms of having too much alcohol.  If your health care provider has given you approval to drink alcohol, do so in moderation and use the following guidelines:  Women should not have more than one drink per day, and men should not have more than two drinks per day. One drink is equal to:  12 oz of beer.  5 oz of wine.  1 oz of hard liquor.  Do not drink on an empty stomach.  Keep yourself hydrated. Have water, diet soda, or unsweetened iced tea.  Regular soda, juice, and other mixers might contain a lot of carbohydrates and should be counted. WHAT FOODS ARE NOT RECOMMENDED? As you make food choices, it is important to remember that all foods are not the same. Some foods have fewer nutrients per serving than other foods, even though they might have the same number of calories or carbohydrates. It is difficult to get your body what it needs when you eat foods with fewer nutrients. Examples of foods that you should avoid that are high in calories and carbohydrates but low in nutrients include:  Trans fats (most processed foods list trans fats on the Nutrition Facts label).  Regular soda.  Juice.  Candy.  Sweets, such as cake, pie, doughnuts, and cookies.  Fried foods. WHAT FOODS CAN I EAT? Eat nutrient-rich foods, which will nourish your body and keep  you healthy. The food you should eat also will depend on several factors, including:  The calories you need.  The medicines you take.  Your weight.  Your blood glucose level.  Your blood pressure level.  Your cholesterol level. You should eat a variety of foods, including:  Protein.  Lean cuts of meat.  Proteins low in saturated fats, such as fish, egg whites, and beans. Avoid  processed meats.  Fruits and vegetables.  Fruits and vegetables that may help control blood glucose levels, such as apples, mangoes, and yams.  Dairy products.  Choose fat-free or low-fat dairy products, such as milk, yogurt, and cheese.  Grains, bread, pasta, and rice.  Choose whole grain products, such as multigrain bread, whole oats, and brown rice. These foods may help control blood pressure.  Fats.  Foods containing healthful fats, such as nuts, avocado, olive oil, canola oil, and fish. DOES EVERYONE WITH DIABETES MELLITUS HAVE THE SAME MEAL PLAN? Because every person with diabetes mellitus is different, there is not one meal plan that works for everyone. It is very important that you meet with a dietitian who will help you create a meal plan that is just right for you.   This information is not intended to replace advice given to you by your health care provider. Make sure you discuss any questions you have with your health care provider.   Document Released: 03/28/2005 Document Revised: 07/22/2014 Document Reviewed: 05/28/2013 Elsevier Interactive Patient Education 2016 [employer]  Basic Carbohydrate Counting for Diabetes Mellitus Carbohydrate counting is a method for keeping track of the amount of carbohydrates you eat. Eating carbohydrates naturally increases the level of sugar (glucose) in your blood, so it is important for you to know the amount that is okay for you to have in every meal. Carbohydrate counting helps keep the level of glucose in your blood within normal limits. The amount of carbohydrates allowed is different for every person. A dietitian can help you calculate the amount that is right for you. Once you know the amount of carbohydrates you can have, you can count the carbohydrates in the foods you want to eat. Carbohydrates are found in the following foods:  Grains, such as breads and cereals.  Dried beans and soy products.  Starchy vegetables, such as  potatoes, peas, and corn.  Fruit and fruit juices.  Milk and yogurt.  Sweets and snack foods, such as cake, cookies, candy, chips, soft drinks, and fruit drinks. CARBOHYDRATE COUNTING There are two ways to count the carbohydrates in your food. You can use either of the methods or a combination of both. Reading the "Nutrition Facts" on Packaged Food The "Nutrition Facts" is an area that is included on the labels of almost all packaged food and beverages in the [place]. It includes the serving size of that food or beverage and information about the nutrients in each serving of the food, including the grams (g) of carbohydrate per serving.  Decide the number of servings of this food or beverage that you will be able to eat or drink. Multiply that number of servings by the number of grams of carbohydrate that is listed on the label for that serving. The total will be the amount of carbohydrates you will be having when you eat or drink this food or beverage. Learning Standard Serving Sizes of Food When you eat food that is not packaged or does not include "Nutrition Facts" on the label, you need to measure the servings in order  to count the amount of carbohydrates.A serving of most carbohydrate-rich foods contains about 15 g of carbohydrates. The following list includes serving sizes of carbohydrate-rich foods that provide 15 g ofcarbohydrate per serving:   1 slice of bread (1 oz) or 1 six-inch tortilla.    of a hamburger bun or English muffin.  4-6 crackers.   cup unsweetened dry cereal.    cup hot cereal.   cup rice or pasta.    cup mashed potatoes or  of a large baked potato.  1 cup fresh fruit or one small piece of fruit.    cup canned or frozen fruit or fruit juice.  1 cup milk.   cup plain fat-free yogurt or yogurt sweetened with artificial sweeteners.   cup cooked dried beans or starchy vegetable, such as peas, corn, or potatoes.  Decide the number of  standard-size servings that you will eat. Multiply that number of servings by 15 (the grams of carbohydrates in that serving). For example, if you eat 2 cups of strawberries, you will have eaten 2 servings and 30 g of carbohydrates (2 servings x 15 g = 30 g). For foods such as soups and casseroles, in which more than one food is mixed in, you will need to count the carbohydrates in each food that is included. EXAMPLE OF CARBOHYDRATE COUNTING Sample Dinner  3 oz chicken breast.   cup of brown rice.   cup of corn.  1 cup milk.   1 cup strawberries with sugar-free whipped topping.  Carbohydrate Calculation Step 1: Identify the foods that contain carbohydrates:   Rice.   Corn.   Milk.   Strawberries. Step 2:Calculate the number of servings eaten of each:   2 servings of rice.   1 serving of corn.   1 serving of milk.   1 serving of strawberries. Step 3: Multiply each of those number of servings by 15 g:   2 servings of rice x 15 g = 30 g.   1 serving of corn x 15 g = 15 g.   1 serving of milk x 15 g = 15 g.   1 serving of strawberries x 15 g = 15 g. Step 4: Add together all of the amounts to find the total grams of carbohydrates eaten: 30 g + 15 g + 15 g + 15 g = 75 g.   This information is not intended to replace advice given to you by your health care provider. Make sure you discuss any questions you have with your health care provider.   Document Released: 07/01/2005 Document Revised: 07/22/2014 Document Reviewed: 05/28/2013 Elsevier Interactive Patient Education [employer]

## 2015-10-26 ENCOUNTER — Ambulatory Visit: Payer: BLUE CROSS/BLUE SHIELD | Admitting: *Deleted

## 2015-11-06 ENCOUNTER — Other Ambulatory Visit: Payer: Self-pay | Admitting: *Deleted

## 2015-11-06 MED ORDER — LISINOPRIL-HYDROCHLOROTHIAZIDE 20-25 MG PO TABS: 1.0000 | ORAL_TABLET | Freq: Every day | ORAL | Status: DC

## 2015-11-17 ENCOUNTER — Ambulatory Visit: Payer: BLUE CROSS/BLUE SHIELD | Admitting: Family Medicine

## 2015-11-20 ENCOUNTER — Ambulatory Visit: Payer: BLUE CROSS/BLUE SHIELD | Admitting: Family Medicine

## 2015-11-27 ENCOUNTER — Encounter: Payer: Self-pay | Admitting: Family Medicine

## 2015-11-27 ENCOUNTER — Other Ambulatory Visit: Payer: Self-pay | Admitting: *Deleted

## 2015-11-27 ENCOUNTER — Ambulatory Visit (INDEPENDENT_AMBULATORY_CARE_PROVIDER_SITE_OTHER): Payer: BLUE CROSS/BLUE SHIELD | Admitting: Family Medicine

## 2015-11-27 VITALS — BP 159/92 | HR 95 | Temp 99.1°F | Resp 20 | Wt 194.5 lb

## 2015-11-27 DIAGNOSIS — I10 Essential (primary) hypertension: Secondary | ICD-10-CM

## 2015-11-27 DIAGNOSIS — E119 Type 2 diabetes mellitus without complications: Secondary | ICD-10-CM

## 2015-11-27 LAB — POCT GLYCOSYLATED HEMOGLOBIN (HGB A1C): Hemoglobin A1C: 5.7

## 2015-11-27 MED ORDER — METFORMIN HCL ER 500 MG PO TB24: ORAL_TABLET | ORAL | Status: DC

## 2015-11-27 MED ORDER — LISINOPRIL-HYDROCHLOROTHIAZIDE 20-12.5 MG PO TABS: ORAL_TABLET | ORAL | Status: DC

## 2015-11-27 MED ORDER — NITROGLYCERIN 0.4 MG SL SUBL: 0.4000 mg | SUBLINGUAL_TABLET | SUBLINGUAL | Status: DC | PRN

## 2015-11-27 NOTE — Progress Notes (Signed)
Patient ID: Stephen Lucero, male   DOB: May 21, 1966, 50 y.o.   MRN: [id number]    Stephen Lucero , 1965/12/05, 45 y.o., male MRN: [id number]  CC: Diabetes and hypertension follow-up Subjective:   diabetes: Patient presents after initial diagnosis of diabetes type 2. He started on metformin now taking 1000 mg daily and tolerating.  He is watching his diet, but is unable to attend the nutrition classes. He does not desire to check his blood sugars daily as of yet. He denies any numbness or tingling in his extremities are nonhealing wounds. He denies any hyper or hypoglycemic events. He lost 12 pounds since his last visit with dietary changes. He has been referred to ophthalmology for diabetic exam.   hypertension: Patient reports compliance with lisinopril daily. He does not check his blood pressures in the outpatient setting. He does try to eat lower sodium meals. He denies any chest pain, shortness of breath, lower extremity edema or syncope. He does ask for refills on nitro today, prescribed by ED prior.   Health maintenance: Patient is in need of all immunizations and screenings. He again declines tetanus and PNA today. He declined a flu shot in the past.    Allergies  Allergen Reactions  . Shellfish-Derived Products Other (See Comments)    oysters   Social History  Substance Use Topics  . Smoking status: Never Smoker   . Smokeless tobacco: Never Used  . Alcohol Use: 7.2 oz/week    12 Cans of beer per week     Comment: Occasionaly on weekends   Past Medical History  Diagnosis Date  . Hypertension   . High cholesterol   . Allergy   . GERD (gastroesophageal reflux disease)   . Diabetes mellitus without complication [facility])    Past Surgical History  Procedure Laterality Date  . Cardiac catheterization     Family History  Problem Relation Age of Onset  . Alcohol abuse Father   . Cirrhosis Father   . Early death Father 59    cirrhosis   . Cancer Maternal Aunt   . Heart disease  Paternal Grandfather   . Hyperlipidemia Paternal Grandfather   . Hypertension Paternal Grandfather      Medication List       This list is accurate as of: 11/27/15 10:59 AM.  Always use your most recent med list.               B-D ASSURE BPM/AUTO ARM CUFF Misc  1 cuff kit (adult large)     lisinopril-hydrochlorothiazide 20-25 MG tablet  Commonly known as:  PRINZIDE,ZESTORETIC  Take 1 tablet by mouth daily.     metFORMIN 500 MG 24 hr tablet  Commonly known as:  GLUCOPHAGE XR  500 mg QD for 7 days then 1000 mg QD.     nitroGLYCERIN 0.4 MG SL tablet  Commonly known as:  NITROSTAT  Place 0.4 mg under the tongue. Reported on 11/27/2015        ROS: Negative, with the exception of above mentioned in HPI  Objective:  BP 159/92 mmHg  Pulse 95  Temp(Src) 99.1 F (37.3 C)  Resp 20  Wt 194 lb 8 oz (88.225 kg)  SpO2 98% Body mass index is 28.51 kg/(m^2). Gen: Afebrile. No acute distress. Nontoxic in appearance, well-developed, well-nourished, Caucasian male. Flushed face. HENT: AT. . MMM, no oral lesions.  Eyes:Pupils Equal Round Reactive to light, Extraocular movements intact,  Conjunctiva without redness, discharge or icterus. CV: RRR, no edema Chest: CTAB,  no wheeze or crackles. Good air movement, normal resp effort.  Abd: Soft.  NTND. BS present Neuro:Normal gait. PERLA. EOMi. Alert. Oriented x3    Assessment/Plan: Stephen Lucero is a 50 y.o. male present for follow up OV for  type 2 diabetes mellitus ([place]) - His a1c today is great 5.7, he has lost 12 lbs by dietary changes. Refills on metformin provided.  - Foot exam completed.  - Eye referral has been placed prior appt. Uncertain if this has been completed.   Essential hypertension, benign - Still elevated above goal today. Patient again was counseled on medication use. Low sodium diet. Exercise. Encouraged patient to obtain a automatic blood cardiac monitor, prescription was provided to him today, and monitor his  blood pressure daily at least 2 hours after taking his medications. - Educated patient on normal blood pressure being below 140/90. - Increased lisinopril 20-12.5 to 2 tabs daily, pt to monitor at home and follow up in 3 months, sooner if abnormal BP.  - Patient is to monitor blood pressure, if remains elevated above parameters given he needs to be seen immediately, otherwise he may be seen 3 months. - Blood Pressure Monitoring (B-D ASSURE BPM/AUTO ARM CUFF) MISC; 1 cuff kit (adult large)  Dispense: 1 each; Refill: 0 - refills on nitro- pt encouraged to make appt immediately if he is needing this medication routinely.  - 3 months  Health maintenance: Discussed with patient we will be offering him immunizations each time he has an appointment in order to attempt to catch him up on immunizations. He declines his tetanus shot and PNA Vacs today. - Patient is in need of tetanus and pneumonia series, continues to decline.  - encouraged him to make a preventive health appt to have labs completed and health care maintenance  gaps addressed.  - Counseled on importance today, however pt does not seem to be concerned over keeping immunizations UTD.   - encouraged daily baby ASA use if able to tolerate.   > 25 minutes spent with patient, >50% of time spent face to face counseling patient and coordinating care.  electronically signed by:  Howard Pouch, DO  [place]

## 2015-11-27 NOTE — Patient Instructions (Signed)
BP still elevated. Take current medication until completed and then start new medication (2 tabs daily).  Metformin refilled.  Continue the good work on diabetes. A1c 5.7! Follow up 3 months sooner, if having any symptoms.

## 2016-02-21 ENCOUNTER — Other Ambulatory Visit: Payer: BLUE CROSS/BLUE SHIELD

## 2016-02-29 ENCOUNTER — Other Ambulatory Visit: Payer: Self-pay | Admitting: *Deleted

## 2016-02-29 DIAGNOSIS — E119 Type 2 diabetes mellitus without complications: Secondary | ICD-10-CM

## 2016-02-29 MED ORDER — METFORMIN HCL ER 500 MG PO TB24: ORAL_TABLET | ORAL | 0 refills | Status: DC

## 2016-02-29 NOTE — Telephone Encounter (Signed)
[facility] OR  RF request for metformin LOV: 11/27/15 Next ov: 03/04/16 Last written: 11/27/15 #60 w/ 2Rf

## 2016-03-04 ENCOUNTER — Encounter: Payer: BLUE CROSS/BLUE SHIELD | Admitting: Family Medicine

## 2016-04-12 ENCOUNTER — Other Ambulatory Visit: Payer: BLUE CROSS/BLUE SHIELD

## 2016-04-15 ENCOUNTER — Other Ambulatory Visit: Payer: BLUE CROSS/BLUE SHIELD

## 2016-04-16 ENCOUNTER — Other Ambulatory Visit: Payer: Self-pay | Admitting: *Deleted

## 2016-04-16 DIAGNOSIS — E119 Type 2 diabetes mellitus without complications: Secondary | ICD-10-CM

## 2016-04-16 MED ORDER — METFORMIN HCL ER 500 MG PO TB24: ORAL_TABLET | ORAL | 0 refills | Status: DC

## 2016-04-16 NOTE — Telephone Encounter (Signed)
Metformin refilled left message for patient needs office visit prior to anymore refills.

## 2016-04-17 ENCOUNTER — Encounter: Payer: BLUE CROSS/BLUE SHIELD | Admitting: Family Medicine

## 2016-04-18 ENCOUNTER — Other Ambulatory Visit: Payer: BLUE CROSS/BLUE SHIELD

## 2016-04-24 ENCOUNTER — Ambulatory Visit (INDEPENDENT_AMBULATORY_CARE_PROVIDER_SITE_OTHER): Payer: BLUE CROSS/BLUE SHIELD | Admitting: Family Medicine

## 2016-04-24 ENCOUNTER — Encounter: Payer: Self-pay | Admitting: Gastroenterology

## 2016-04-24 ENCOUNTER — Encounter: Payer: Self-pay | Admitting: Family Medicine

## 2016-04-24 VITALS — BP 165/90 | HR 81 | Temp 99.0°F | Resp 20 | Ht 69.0 in | Wt 190.8 lb

## 2016-04-24 DIAGNOSIS — Z125 Encounter for screening for malignant neoplasm of prostate: Secondary | ICD-10-CM

## 2016-04-24 DIAGNOSIS — Z79899 Other long term (current) drug therapy: Secondary | ICD-10-CM | POA: Diagnosis not present

## 2016-04-24 DIAGNOSIS — Z13220 Encounter for screening for lipoid disorders: Secondary | ICD-10-CM

## 2016-04-24 DIAGNOSIS — Z0000 Encounter for general adult medical examination without abnormal findings: Secondary | ICD-10-CM

## 2016-04-24 DIAGNOSIS — Z130 Encounter for screening for diseases of the blood and blood-forming organs and certain disorders involving the immune mechanism: Secondary | ICD-10-CM | POA: Diagnosis not present

## 2016-04-24 DIAGNOSIS — E119 Type 2 diabetes mellitus without complications: Secondary | ICD-10-CM | POA: Diagnosis not present

## 2016-04-24 DIAGNOSIS — Z23 Encounter for immunization: Secondary | ICD-10-CM | POA: Diagnosis not present

## 2016-04-24 DIAGNOSIS — Z1159 Encounter for screening for other viral diseases: Secondary | ICD-10-CM

## 2016-04-24 DIAGNOSIS — I10 Essential (primary) hypertension: Secondary | ICD-10-CM

## 2016-04-24 DIAGNOSIS — Z114 Encounter for screening for human immunodeficiency virus [HIV]: Secondary | ICD-10-CM

## 2016-04-24 DIAGNOSIS — Z1211 Encounter for screening for malignant neoplasm of colon: Secondary | ICD-10-CM

## 2016-04-24 LAB — LIPID PANEL
CHOLESTEROL: 233 mg/dL — AB (ref 0–200)
HDL: 51.3 mg/dL (ref >39.00)
LDL Cholesterol: 165 mg/dL — ABNORMAL HIGH (ref 0–99)
NonHDL: 182.06
TRIGLYCERIDES: 86 mg/dL (ref 0.0–149.0)
Total CHOL/HDL Ratio: 5
VLDL: 17.2 mg/dL (ref 0.0–40.0)

## 2016-04-24 LAB — COMPREHENSIVE METABOLIC PANEL
ALT: 27 U/L (ref 0–53)
AST: 17 U/L (ref 0–37)
Albumin: 4.8 g/dL (ref 3.5–5.2)
Alkaline Phosphatase: 55 U/L (ref 39–117)
BILIRUBIN TOTAL: 0.7 mg/dL (ref 0.2–1.2)
BUN: 12 mg/dL (ref 6–23)
CALCIUM: 10.4 mg/dL (ref 8.4–10.5)
CO2: 30 mEq/L (ref 19–32)
Chloride: 96 mEq/L (ref 96–112)
Creatinine, Ser: 0.87 mg/dL (ref 0.40–1.50)
GFR: 97.7 mL/min (ref >60.00)
Glucose, Bld: 120 mg/dL — ABNORMAL HIGH (ref 70–99)
Potassium: 4.8 mEq/L (ref 3.5–5.1)
Sodium: 136 mEq/L (ref 135–145)
TOTAL PROTEIN: 8.3 g/dL (ref 6.0–8.3)

## 2016-04-24 LAB — CBC WITH DIFFERENTIAL/PLATELET
BASOS ABS: 0 10*3/uL (ref 0.0–0.1)
Basophils Relative: 0.2 % (ref 0.0–3.0)
Eosinophils Absolute: 0.3 10*3/uL (ref 0.0–0.7)
Eosinophils Relative: 3.4 % (ref 0.0–5.0)
HEMATOCRIT: 45.5 % (ref 39.0–52.0)
Hemoglobin: 15.6 g/dL (ref 13.0–17.0)
Lymphocytes Relative: 23.5 % (ref 12.0–46.0)
Lymphs Abs: 1.7 10*3/uL (ref 0.7–4.0)
MCHC: 34.2 g/dL (ref 30.0–36.0)
MCV: 90.2 fl (ref 78.0–100.0)
MONOS PCT: 9.1 % (ref 3.0–12.0)
Monocytes Absolute: 0.7 10*3/uL (ref 0.1–1.0)
NEUTROS ABS: 4.7 10*3/uL (ref 1.4–7.7)
Neutrophils Relative %: 63.8 % (ref 43.0–77.0)
PLATELETS: 234 10*3/uL (ref 150.0–400.0)
RBC: 5.04 Mil/uL (ref 4.22–5.81)
RDW: 12.9 % (ref 11.5–15.5)
WBC: 7.3 10*3/uL (ref 4.0–10.5)

## 2016-04-24 LAB — HEMOGLOBIN A1C: Hgb A1c MFr Bld: 5.9 % (ref 4.6–6.5)

## 2016-04-24 LAB — PSA: PSA: 0.76 ng/mL (ref 0.10–4.00)

## 2016-04-24 LAB — TSH: TSH: 0.82 u[IU]/mL (ref 0.35–4.50)

## 2016-04-24 NOTE — Progress Notes (Signed)
Patient ID: Stephen Lucero, male  DOB: August 16, 1965, 50 y.o.   MRN: [id number] Patient Care Team    Relationship Specialty Notifications Start End  Ma Hillock, DO PCP - General Family Medicine  08/22/15     Subjective:  Stephen Lucero is a 50 y.o. male present for CPE. All past medical history, surgical history, allergies, family history, immunizations, medications and social history were updated in the electronic medical record today. All recent labs, ED visits and hospitalizations within the last year were reviewed.   Essential hypertension, benign: pt has only been taking 1- lisinpril/HCTZ, instead of the 2 pills scheduled. BP elevated today.Denies chest pain , shortness of breath, dizziness or lower ext edema.   diabetes mellitus (HCC) Tolerating metformin 1000 mg QD. Last a1c 8.7--> 5.7. He does not check her blood sugars. He denies nonhealing wounds, hypo/hyperglycemic events  Or numbness tingling in his extremities.   - Prevnar administered 04/24/2016, PSV 23 in 1 year.  - Pt on Acei - Eye exam: 11/2015--> records requested.  - Foot exam: 11/27/2015 - Comp Met (CMET) yearly, ordered today - HgB A1c  Health maintenance:  Colonoscopy: Never, no FHX. Referral placed.  Immunizations:  tdap 04/2016, influenza declined flu shot today (encourage early), PNA series started today prevnar 13 , 04/24/2016--> PSv 04/2017 Infectious disease screening: HIV and Hep C agreeable to screen today PSA:  Lab Results  Component Value Date   PSA 0.76 04/24/2016  , pt was counseled on prostate cancer screenings.  Assistive device: no Oxygen use: no  Patient has a Dental home. Hospitalizations/ED visits: no  Immunization History  Administered Date(s) Administered  . Pneumococcal Conjugate-13 04/24/2016  . Tdap 04/24/2016    Past Medical History:  Diagnosis Date  . Allergy   . Diabetes mellitus without complication ([place])   . GERD (gastroesophageal reflux disease)   . High cholesterol    . Hypertension    Allergies  Allergen Reactions  . Shellfish-Derived Products Other (See Comments)    oysters   Past Surgical History:  Procedure Laterality Date  . CARDIAC CATHETERIZATION     Family History  Problem Relation Age of Onset  . Alcohol abuse Father   . Cirrhosis Father   . Early death Father 65    cirrhosis   . Cancer Maternal Aunt   . Heart disease Paternal Grandfather   . Hyperlipidemia Paternal Grandfather   . Hypertension Paternal Grandfather    Social History   Social History  . Marital status: Single    Spouse name: N/A  . Number of children: N/A  . Years of education: N/A   Occupational History  . Not on file.   Social History Main Topics  . Smoking status: Never Smoker  . Smokeless tobacco: Never Used  . Alcohol use 7.2 oz/week    12 Cans of beer per week     Comment: Occasionaly on weekends  . Drug use: No  . Sexual activity: Yes   Other Topics Concern  . Not on file   Social History Narrative   Single. No children.   11th grade education.    Wears seatbelt   Regular diet.    Smoke detector in the home.    Feels safe in relationship.    No firearms in the home.      Medication List       Accurate as of 04/24/16  5:27 PM. Always use your most recent med list.  lisinopril-hydrochlorothiazide 20-12.5 MG tablet Commonly known as:  PRINZIDE,ZESTORETIC 2 tabs daily.   metFORMIN 500 MG 24 hr tablet Commonly known as:  GLUCOPHAGE XR 1000 mg QD.   nitroGLYCERIN 0.4 MG SL tablet Commonly known as:  NITROSTAT Place 1 tablet (0.4 mg total) under the tongue every 5 (five) minutes as needed for chest pain. Reported on 11/27/2015        Recent Results (from the past 2160 hour(s))  CBC w/Diff     Status: None   Collection Time: 04/24/16  9:46 AM  Result Value Ref Range   WBC 7.3 4.0 - 10.5 K/uL   RBC 5.04 4.22 - 5.81 Mil/uL   Hemoglobin 15.6 13.0 - 17.0 g/dL   HCT 45.5 39.0 - 52.0 %   MCV 90.2 78.0 - 100.0 fl    MCHC 34.2 30.0 - 36.0 g/dL   RDW 12.9 11.5 - 15.5 %   Platelets 234.0 150.0 - 400.0 K/uL   Neutrophils Relative % 63.8 43.0 - 77.0 %   Lymphocytes Relative 23.5 12.0 - 46.0 %   Monocytes Relative 9.1 3.0 - 12.0 %   Eosinophils Relative 3.4 0.0 - 5.0 %   Basophils Relative 0.2 0.0 - 3.0 %   Neutro Abs 4.7 1.4 - 7.7 K/uL   Lymphs Abs 1.7 0.7 - 4.0 K/uL   Monocytes Absolute 0.7 0.1 - 1.0 K/uL   Eosinophils Absolute 0.3 0.0 - 0.7 K/uL   Basophils Absolute 0.0 0.0 - 0.1 K/uL  Comp Met (CMET)     Status: Abnormal   Collection Time: 04/24/16  9:46 AM  Result Value Ref Range   Sodium 136 135 - 145 mEq/L   Potassium 4.8 3.5 - 5.1 mEq/L   Chloride 96 96 - 112 mEq/L   CO2 30 19 - 32 mEq/L   Glucose, Bld 120 (H) 70 - 99 mg/dL   BUN 12 6 - 23 mg/dL   Creatinine, Ser 0.87 0.40 - 1.50 mg/dL   Total Bilirubin 0.7 0.2 - 1.2 mg/dL   Alkaline Phosphatase 55 39 - 117 U/L   AST 17 0 - 37 U/L   ALT 27 0 - 53 U/L   Total Protein 8.3 6.0 - 8.3 g/dL   Albumin 4.8 3.5 - 5.2 g/dL   Calcium 10.4 8.4 - 10.5 mg/dL   GFR 97.70 >60.00 mL/min  HgB A1c     Status: None   Collection Time: 04/24/16  9:46 AM  Result Value Ref Range   Hgb A1c MFr Bld 5.9 4.6 - 6.5 %    Comment: Glycemic Control Guidelines for People with Diabetes:Non Diabetic:  <6%Goal of Therapy: <7%Additional Action Suggested:  >8%   PSA     Status: None   Collection Time: 04/24/16  9:46 AM  Result Value Ref Range   PSA 0.76 0.10 - 4.00 ng/mL  TSH     Status: None   Collection Time: 04/24/16  9:46 AM  Result Value Ref Range   TSH 0.82 0.35 - 4.50 uIU/mL  Lipid panel     Status: Abnormal   Collection Time: 04/24/16  9:46 AM  Result Value Ref Range   Cholesterol 233 (H) 0 - 200 mg/dL    Comment: ATP III Classification       Desirable:  < 200 mg/dL               Borderline High:  200 - 239 mg/dL          High:  > = 240 mg/dL  Triglycerides 86.0 0.0 - 149.0 mg/dL    Comment: Normal:  <150 mg/dLBorderline High:  150 - 199 mg/dL   HDL  51.30 >39.00 mg/dL   VLDL 17.2 0.0 - 40.0 mg/dL   LDL Cholesterol 165 (H) 0 - 99 mg/dL   Total CHOL/HDL Ratio 5     Comment:                Men          Women1/2 Average Risk     3.4          3.3Average Risk          5.0          4.42X Average Risk          9.6          7.13X Average Risk          15.0          11.0                       NonHDL 182.06     Comment: NOTE:  Non-HDL goal should be 30 mg/dL higher than patient's LDL goal (i.e. LDL goal of < 70 mg/dL, would have non-HDL goal of < 100 mg/dL)    ROS: 14 pt review of systems performed and negative (unless mentioned in an HPI)  Objective: BP (!) 165/90 (BP Location: Left Arm, Patient Position: Sitting, Cuff Size: Normal)   Pulse 81   Temp 99 F (37.2 C)   Resp 20   Ht _0  (1.753 m)   Wt 190 lb 12.8 oz (86.5 kg)   SpO2 97%   BMI 28.18 kg/m  Gen: Afebrile. No acute distress. Nontoxic in appearance, well-developed, well-nourished, caucasian male .very pleasant. Overweight.  HENT: AT. Stephen Lucero. Bilateral TM visualized and normal in appearance, normal external auditory canal. MMM, no oral lesions, adequate dentition. Bilateral nares within normal limits. Throat without erythema, ulcerations or exudates. no Cough on exam, no hoarseness on exam. Eyes:Pupils Equal Round Reactive to light, Extraocular movements intact,  Conjunctiva without redness, discharge or icterus. Neck/lymp/endocrine: Supple,no lymphadenopathy, no thyromegaly CV: RRR no murmur, no edema, +2/4 P posterior tibialis pulses. No  carotid bruits Chest: CTAB, no wheeze, rhonchi or crackles. normal Respiratory effort. good Air movement. Abd: Soft. round. NTND. BS present. no Masses palpated. No hepatosplenomegaly. No rebound tenderness or guarding. Skin: no rashes, purpura or petechiae. Warm and well-perfused. Skin intact. Neuro/Msk:  Normal gait. PERLA. EOMi. Alert. Oriented x3.  Cranial nerves II through XII intact. Muscle strength 5/5 upper/lower extremity. DTRs equal  bilaterally. Psych: Normal affect, dress and demeanor. Normal speech. Normal thought content and judgment.   Assessment/plan: Stephen Lucero is a 50 y.o. male present for CPE. Encounter for preventive health examination Patient was encouraged to exercise greater than 150 minutes a week. Patient was encouraged to choose a diet filled with fresh fruits and vegetables, and lean meats. AVS provided to patient today for education/recommendation on gender specific health and safety maintenance. Colonoscopy: Never, no FHX. Referral placed today Immunizations:  tdap 04/2016, influenza declined flu shot today (encourage early), PNA series started today prevnar 13 , 04/24/2016--> PSv 04/2017 Infectious disease screening: HIV and Hep C agreeable to screen today PSA screening today Need for diphtheria-tetanus-pertussis (Tdap) vaccine - Tdap vaccine greater than or equal to 7yo IM Screening for deficiency anemia - CBC w/Diff Encounter for long-term (current) use of medications - CBC w/Diff - Comp Met (CMET)  Colon cancer screening - Ambulatory referral to Gastroenterology Prostate cancer screening - PSA Screening cholesterol level - Lipid panel; Future Encounter for screening for HIV - HIV antibody (with reflex) Need for hepatitis C screening test - Hepatitis C Antibody   Essential hypertension, benign - pt to take 2- of the lisinpril/HCTZ and schedule nurse appt Friday to recheck. He did not realize the bottle said take 2 pills.  - CBC w/Diff - Lipid panel; Future - TSH  diabetes mellitus ([place]) - Continue metformin, rpt a1c today. HAd been well controlled on metformin only. He doe snot desire to check his BG.  - Prevnar administered today, PSV 23 in 1 year.  - Pt on Acei - encouraged daily baby ASA - Eye exam: 11/2015--> records requested.  - Foot exam: 11/27/2015 - Comp Met (CMET) yearly, ordered today - HgB A1c   Return in about 3 months (around 07/29/2016), or  diabetes/htn.  Electronically signed by: Howard Pouch, DO [place]

## 2016-04-24 NOTE — Patient Instructions (Addendum)
You received your flu shot and 1st pneumonia shot today.  I placed a referral to have your colonoscopy screening.  Please take 2 of your Blood pressure pills, and stop in Friday for a nurse visit (you will need to make an appt for nurse visit) to have your BP rechecked.  Take a daily baby aspirin.  I will call you with all lab results once available.   Health Maintenance, Male A healthy lifestyle and preventative care can promote health and wellness.  Maintain regular health, dental, and eye exams.  Eat a healthy diet. Foods like vegetables, fruits, whole grains, low-fat dairy products, and lean protein foods contain the nutrients you need and are low in calories. Decrease your intake of foods high in solid fats, added sugars, and salt. Get information about a proper diet from your health care provider, if necessary.  Regular physical exercise is one of the most important things you can do for your health. Most adults should get at least 150 minutes of moderate-intensity exercise (any activity that increases your heart rate and causes you to sweat) each week. In addition, most adults need muscle-strengthening exercises on 2 or more days a week.   Maintain a healthy weight. The body mass index (BMI) is a screening tool to identify possible weight problems. It provides an estimate of body fat based on height and weight. Your health care provider can find your BMI and can help you achieve or maintain a healthy weight. For males 20 years and older:  A BMI below 18.5 is considered underweight.  A BMI of 18.5 to 24.9 is normal.  A BMI of 25 to 29.9 is considered overweight.  A BMI of 30 and above is considered obese.  Maintain normal blood lipids and cholesterol by exercising and minimizing your intake of saturated fat. Eat a balanced diet with plenty of fruits and vegetables. Blood tests for lipids and cholesterol should begin at age 10520 and be repeated every 5 years. If your lipid or cholesterol  levels are high, you are over age 52, or you are at high risk for heart disease, you may need your cholesterol levels checked more frequently.Ongoing high lipid and cholesterol levels should be treated with medicines if diet and exercise are not working.  If you smoke, find out from your health care provider how to quit. If you do not use tobacco, do not start.  Lung cancer screening is recommended for adults aged 55-80 years who are at high risk for developing lung cancer because of a history of smoking. A yearly low-dose CT scan of the lungs is recommended for people who have at least a 30-pack-year history of smoking and are current smokers or have quit within the past 15 years. A pack year of smoking is smoking an average of 1 pack of cigarettes a day for 1 year (for example, a 30-pack-year history of smoking could mean smoking 1 pack a day for 30 years or 2 packs a day for 15 years). Yearly screening should continue until the smoker has stopped smoking for at least 15 years. Yearly screening should be stopped for people who develop a health problem that would prevent them from having lung cancer treatment.  If you choose to drink alcohol, do not have more than 2 drinks per day. One drink is considered to be 12 oz (360 mL) of beer, 5 oz (150 mL) of wine, or 1.5 oz (45 mL) of liquor.  Avoid the use of street drugs. Do not  share needles with anyone. Ask for help if you need support or instructions about stopping the use of drugs.  High blood pressure causes heart disease and increases the risk of stroke. High blood pressure is more likely to develop in:  People who have blood pressure in the end of the normal range (100-139/85-89 mm Hg).  People who are overweight or obese.  People who are African American.  If you are 20-62 years of age, have your blood pressure checked every 3-5 years. If you are 67 years of age or older, have your blood pressure checked every year. You should have your blood  pressure measured twice--once when you are at a hospital or clinic, and once when you are not at a hospital or clinic. Record the average of the two measurements. To check your blood pressure when you are not at a hospital or clinic, you can use:  An automated blood pressure machine at a pharmacy.  A home blood pressure monitor.  If you are 70-11 years old, ask your health care provider if you should take aspirin to prevent heart disease.  Diabetes screening involves taking a blood sample to check your fasting blood sugar level. This should be done once every 3 years after age 96 if you are at a normal weight and without risk factors for diabetes. Testing should be considered at a younger age or be carried out more frequently if you are overweight and have at least 1 risk factor for diabetes.  Colorectal cancer can be detected and often prevented. Most routine colorectal cancer screening begins at the age of 39 and continues through age 41. However, your health care provider may recommend screening at an earlier age if you have risk factors for colon cancer. On a yearly basis, your health care provider may provide home test kits to check for hidden blood in the stool. A small camera at the end of a tube may be used to directly examine the colon (sigmoidoscopy or colonoscopy) to detect the earliest forms of colorectal cancer. Talk to your health care provider about this at age 45 when routine screening begins. A direct exam of the colon should be repeated every 5-10 years through age 40, unless early forms of precancerous polyps or small growths are found.  People who are at an increased risk for hepatitis B should be screened for this virus. You are considered at high risk for hepatitis B if:  You were born in a country where hepatitis B occurs often. Talk with your health care provider about which countries are considered high risk.  Your parents were born in a high-risk country and you have not  received a shot to protect against hepatitis B (hepatitis B vaccine).  You have HIV or AIDS.  You use needles to inject street drugs.  You live with, or have sex with, someone who has hepatitis B.  You are a man who has sex with other men (MSM).  You get hemodialysis treatment.  You take certain medicines for conditions like cancer, organ transplantation, and autoimmune conditions.  Hepatitis C blood testing is recommended for all people born from [birth year] through 1967 and any individual with known risk factors for hepatitis C.  Healthy men should no longer receive prostate-specific antigen (PSA) blood tests as part of routine cancer screening. Talk to your health care provider about prostate cancer screening.  Testicular cancer screening is not recommended for adolescents or adult males who have no symptoms. Screening includes self-exam, a health  care provider exam, and other screening tests. Consult with your health care provider about any symptoms you have or any concerns you have about testicular cancer.  Practice safe sex. Use condoms and avoid high-risk sexual practices to reduce the spread of sexually transmitted infections (STIs).  You should be screened for STIs, including gonorrhea and chlamydia if:  You are sexually active and are younger than 24 years.  You are older than 24 years, and your health care provider tells you that you are at risk for this type of infection.  Your sexual activity has changed since you were last screened, and you are at an increased risk for chlamydia or gonorrhea. Ask your health care provider if you are at risk.  If you are at risk of being infected with HIV, it is recommended that you take a prescription medicine daily to prevent HIV infection. This is called pre-exposure prophylaxis (PrEP). You are considered at risk if:  You are a man who has sex with other men (MSM).  You are a heterosexual man who is sexually active with multiple  partners.  You take drugs by injection.  You are sexually active with a partner who has HIV.  Talk with your health care provider about whether you are at high risk of being infected with HIV. If you choose to begin PrEP, you should first be tested for HIV. You should then be tested every 3 months for as long as you are taking PrEP.  Use sunscreen. Apply sunscreen liberally and repeatedly throughout the day. You should seek shade when your shadow is shorter than you. Protect yourself by wearing long sleeves, pants, a wide-brimmed hat, and sunglasses year round whenever you are outdoors.  Tell your health care provider of new moles or changes in moles, especially if there is a change in shape or color. Also, tell your health care provider if a mole is larger than the size of a pencil eraser.  A one-time screening for abdominal aortic aneurysm (AAA) and surgical repair of large AAAs by ultrasound is recommended for men aged 89-75 years who are current or former smokers.  Stay current with your vaccines (immunizations).   This information is not intended to replace advice given to you by your health care provider. Make sure you discuss any questions you have with your health care provider.   Document Released: 12/28/2007 Document Revised: 07/22/2014 Document Reviewed: 11/26/2010 Elsevier Interactive Patient Education [employer].

## 2016-04-25 ENCOUNTER — Telehealth: Payer: Self-pay | Admitting: Family Medicine

## 2016-04-25 DIAGNOSIS — E785 Hyperlipidemia, unspecified: Secondary | ICD-10-CM

## 2016-04-25 LAB — HIV ANTIBODY (ROUTINE TESTING W REFLEX): HIV: NONREACTIVE

## 2016-04-25 LAB — HEPATITIS C ANTIBODY: HCV Ab: NEGATIVE

## 2016-04-25 MED ORDER — ATORVASTATIN CALCIUM 40 MG PO TABS: 40.0000 mg | ORAL_TABLET | Freq: Every day | ORAL | 3 refills | Status: DC

## 2016-04-25 NOTE — Telephone Encounter (Signed)
Please call pt: - his labs looked good, with the following exceptions:   - His cholesterol is elevated. Being a diabetic with hypertension, I would recommend a statin medication for both lowering cholesterol and the cardiovascular protection it provides. He is at increased risk for cardiovascular event with history and cholesterol level.  - I have called in this medication for him to start, I would want to retest cholesterol fasting in 3 months, and we can do that on his next HTN follow up etc. in 3 months.

## 2016-04-25 NOTE — Telephone Encounter (Signed)
Patient states he is unable to take lipitor he says he took tried that years ago and it caused his muscles to be so weak he could hardy walk. Patient wants to know if there is an alternative. Please advise.

## 2016-04-26 MED ORDER — SIMVASTATIN 20 MG PO TABS: 20.0000 mg | ORAL_TABLET | Freq: Every day | ORAL | 3 refills | Status: DC

## 2016-04-26 NOTE — Telephone Encounter (Signed)
Tried to notify patient voice mail not set up.

## 2016-04-26 NOTE — Telephone Encounter (Signed)
Yes, we can try simvastatin instead. I have called this into his pharmacy. Start by taking every other day for a week, if tolerating then increase to everyday. This has been called in to his pharmacy.

## 2016-04-29 NOTE — Telephone Encounter (Signed)
Spoke with patient reviewed information. 

## 2016-05-06 ENCOUNTER — Other Ambulatory Visit: Payer: Self-pay | Admitting: *Deleted

## 2016-05-06 DIAGNOSIS — I10 Essential (primary) hypertension: Secondary | ICD-10-CM

## 2016-05-06 DIAGNOSIS — E119 Type 2 diabetes mellitus without complications: Secondary | ICD-10-CM

## 2016-05-06 MED ORDER — METFORMIN HCL ER 500 MG PO TB24: ORAL_TABLET | ORAL | 0 refills | Status: DC

## 2016-05-06 MED ORDER — LISINOPRIL-HYDROCHLOROTHIAZIDE 20-12.5 MG PO TABS: ORAL_TABLET | ORAL | 1 refills | Status: DC

## 2016-06-26 ENCOUNTER — Encounter: Payer: BLUE CROSS/BLUE SHIELD | Admitting: Gastroenterology

## 2016-07-26 ENCOUNTER — Encounter: Payer: Self-pay | Admitting: Family Medicine

## 2016-07-26 ENCOUNTER — Ambulatory Visit (INDEPENDENT_AMBULATORY_CARE_PROVIDER_SITE_OTHER): Payer: BLUE CROSS/BLUE SHIELD | Admitting: Family Medicine

## 2016-07-26 DIAGNOSIS — Z0289 Encounter for other administrative examinations: Secondary | ICD-10-CM

## 2016-08-16 ENCOUNTER — Ambulatory Visit (INDEPENDENT_AMBULATORY_CARE_PROVIDER_SITE_OTHER): Payer: BLUE CROSS/BLUE SHIELD | Admitting: Family Medicine

## 2016-08-16 ENCOUNTER — Encounter: Payer: Self-pay | Admitting: Family Medicine

## 2016-08-16 VITALS — BP 136/75 | HR 75 | Temp 99.0°F | Resp 20 | Ht 69.0 in | Wt 198.5 lb

## 2016-08-16 DIAGNOSIS — I10 Essential (primary) hypertension: Secondary | ICD-10-CM | POA: Diagnosis not present

## 2016-08-16 DIAGNOSIS — R079 Chest pain, unspecified: Secondary | ICD-10-CM

## 2016-08-16 DIAGNOSIS — E785 Hyperlipidemia, unspecified: Secondary | ICD-10-CM | POA: Diagnosis not present

## 2016-08-16 DIAGNOSIS — I2510 Atherosclerotic heart disease of native coronary artery without angina pectoris: Secondary | ICD-10-CM

## 2016-08-16 DIAGNOSIS — E119 Type 2 diabetes mellitus without complications: Secondary | ICD-10-CM

## 2016-08-16 LAB — POCT GLYCOSYLATED HEMOGLOBIN (HGB A1C): Hemoglobin A1C: 5.9

## 2016-08-16 NOTE — Patient Instructions (Signed)
Please monitor your BP and record. If > 140/90 routinely, while at rest we will need to increase your BP pills. Otherwise continue to take 1 pill daily of the lisinopril/HCTZ.  Low salt diet.  Exercise > 150 minutes a week. A1c is 5.9, this is good control of diabetes.   I will check your cholesterol again after you are routinely taking the cholesterol lowering medicine.  I am referring  you to cardiology to be eva;uated for the chest pain with lifting. If you experience chest pain that is not relieved by the nitro go the ED immediatly.

## 2016-08-16 NOTE — Progress Notes (Signed)
Patient ID: Stephen Lucero, male  DOB: 1966-01-06, 51 y.o.   MRN: [id number] Patient Care Team    Relationship Specialty Notifications Start End  Natalia Leatherwood, DO PCP - General Family Medicine  08/22/15     Subjective:  Stephen Lucero is a 51 y.o. male present for diabetes follow up. All past medical history, surgical history, allergies, family history, immunizations, medications and social history were updated in the electronic medical record today. All recent labs, ED visits and hospitalizations within the last year were reviewed.  Essential hypertension, benign/Hyperlipidemia: pt has only been taking 1- lisinpril/HCTZ again. His states he sometimes takes two and sometimes one. He has not been watching his diet or exercising, but states he is going to start soon. He reports 2x a few weeks ago he experienced "heaviness" like something sitting on his chest after lifting wood. He rested for a few minutes and the sensation went away. One of the times he took a nitro, but he does not think it helped. He did not seek treatment for these events. He denies diaphoresis, radiation of pain or nausea with the events. He denies current discomfort. He denies shortness of breath, dizziness or lower ext edema. He states he has not been taking the zocor routinely, and routinely forgets to take it, he does average about 2-3 times a week taking the medicine. He has a fhx of heart disease.He has had studies completed in 2012. EKG 04/2011 Ventricular Rate 95BPM  Atrial Rate95BPM  P-R Interval 162 ms QRS Duration 82ms Q-T Interval 350 ms QTC439 ms P Axis 58degrees  R  Axis -21 degrees  T Axis 51degrees  Sinus rhythm Normal ECG Echocardiogram 02/2011 Normal left ventricular size Mild left ventricular concentric hypertrophy Preserved left ventricular systolic function No segmental wall motion abnormalities Normal right ventricular size Normal right ventricular function Dilated left atrium No significant valve stenosis No significant valvular regurgitation No pericardial effusion No old study for comparison Small 1 cm diameter mass protruding from the interatrial septum  into the right atrium.Uncertain etiology.This may represent  unusual lipomatous hypertrophy, but cannot exclude myxoma. Suggest cardiac MRI for further clarification. I have personally reviewed and interpreted this echocardiogram.  [facility] MR left heart INTERPRETATION 1. No evidence of a right atrial mass. Significant lipomatous hypertrophy of the interatrial septum, which accounts for the abnormality seen on echocardiography.2. Normal LV size and systolic function, EF is 69%.3. No evidence of myocardial hyperenhancement suggestive of fibrosis or infarction. CARDIAC CATHETERIZATION PROCEDURE(S): 02/2011 Left heart cath, Coronary Angiography and Left Ventriculogram (40981)  STUDY INDICATION(S): Unstable Angina (411.1) APPROACH: Approach 1: Percutaneous right radial artery Sheath: 9F CATHETERS: 9F Tiger Radial 4.0, 9F Pig Infiniti, 9F JR4 Impulse Diagnostic  Catheter HEMODYNAMIC DATA:  Height: 67 in.  Weight: 207 lbs. LEFT HEART CATHETERIZATION PROTOCOL PRESSURES:  SiteBaseline Values, mm HgS/P Angiography, mm Hg  S/D Mean S/DMean  AO 139 / 1152 OTHER HEMODYNAMIC  INFORMATION:  Heart Rate:65  CARDIOVASCULAR ANGIOGRAPHY LEFT VENTRICULOGRAPHY:  Ejection Fraction: 60%  LV Chamber Size: Normal  Regional Wall Motion:Normal  CORONARY ANGIOGRAPHY: Vessel SegmentVessel TypeStenosis (%)DescriptionComment Proximal LADNative 50 3rd DiagonalNative 50 Ramus Present: No Coronary Dominance:Right CONCLUSIONS:  CORONARY STATUS: Moderate non-obstructive ASCAD  LV FUNCTION:  Ejection Fraction: 60%  Wall Motion: Normal   OTHER:LM nl LAD prox 50%, Diag 50% LCX nl RCA nl    diabetes mellitus (HCC) patient is tolerating metformin  Tolerating metformin 1000 mg QD. A1c  Started at 8.7, now  has trended to 5.9 with metformin use. He does not desire to check blood sugars. He denies nonhealing wounds, numbness tingling in his extremities.  He reports one episode of feeling weak and his friend took his BG and it was "low". He ate a piece of candy and resolved.  - declined his flu shot. Encouraged yearly - Prevnar administered 04/24/2016, PSV 23 in 1 year.  - Pt on Acei - Eye exam: 11/2015--> records requested Again.  - Foot exam: 11/27/2015 - BMP yearly; last 04/2016 normal.   Immunization History  Administered Date(s) Administered  . Pneumococcal Conjugate-13 04/24/2016  . Tdap 04/24/2016    Past Medical History:  Diagnosis Date  . Allergy   . Diabetes mellitus without complication (HCC)   . GERD (gastroesophageal reflux disease)   . High cholesterol   . Hypertension    Allergies  Allergen Reactions  . Lipitor [Atorvastatin] Other (See Comments)    mylagia  . Shellfish-Derived Products Other (See Comments)    oysters   Past Surgical History:  Procedure Laterality Date  . CARDIAC CATHETERIZATION     Family History  Problem Relation Age of Onset    . Alcohol abuse Father   . Cirrhosis Father   . Early death Father 535    cirrhosis   . Cancer Maternal Aunt   . Heart disease Paternal Grandfather   . Hyperlipidemia Paternal Grandfather   . Hypertension Paternal Grandfather    Social History   Social History  . Marital status: Single    Spouse name: N/A  . Number of children: N/A  . Years of education: N/A   Occupational History  . Not on file.   Social History Main Topics  . Smoking status: Never Smoker  . Smokeless tobacco: Never Used  . Alcohol use 7.2 oz/week    12 Cans of beer per week     Comment: Occasionaly on weekends  . Drug use: No  . Sexual activity: Yes   Other Topics Concern  . Not on file   Social History Narrative   Single. No children.   11th grade education.    Wears seatbelt   Regular diet.    Smoke detector in the home.    Feels safe in relationship.    No firearms in the home.    Allergies as of 08/16/2016      Reactions   Lipitor [atorvastatin] Other (See Comments)   mylagia   Shellfish-derived Products Other (See Comments)   oysters      Medication List       Accurate as of 08/16/16  2:21 PM. Always use your most recent med list.          lisinopril-hydrochlorothiazide 20-12.5 MG tablet Commonly known as:  PRINZIDE,ZESTORETIC 2 tabs daily.   metFORMIN 500 MG 24 hr tablet Commonly known as:  GLUCOPHAGE XR 1000 mg QD.   nitroGLYCERIN 0.4 MG SL tablet Commonly known as:  NITROSTAT Place 1 tablet (0.4 mg total) under the tongue every 5 (five) minutes as needed for chest pain. Reported on 11/27/2015   simvastatin 20 MG tablet Commonly known as:  ZOCOR Take 1 tablet (20 mg total) by mouth at bedtime.        Recent Results (from the past 2160 hour(s))  POCT glycosylated hemoglobin (Hb A1C)     Status: Abnormal   Collection Time: 08/16/16  1:33 PM  Result Value Ref Range   Hemoglobin A1C 5.9     ROS: 14 pt review of systems  performed and negative (unless mentioned in an  HPI)  Objective: BP 136/75 (BP Location: Left Arm, Patient Position: Sitting, Cuff Size: Normal)   Pulse 75   Temp 99 F (37.2 C)   Resp 20   Ht 5\' 9"  (1.753 m)   Wt 198 lb 8 oz (90 kg)   SpO2 97%   BMI 29.31 kg/m  Gen: Afebrile. No acute distress. Nontoxic in appearance. Overweight caucaisan male.  HENT: AT. [place].  MMM.  Eyes:Pupils Equal Round Reactive to light, Extraocular movements intact,  Conjunctiva without redness, discharge or icterus. CV: RRR no murmur, no edema, +2/4 P posterior tibialis pulses Chest: CTAB, no wheeze or crackles Abd: Soft. NTND. BS present.  Neuro:  Normal gait. PERLA. EOMi. Alert. Oriented.  Psych: Normal affect, dress and demeanor. Normal speech. Normal thought content and judgment..    Assessment/plan: Stephen Lucero is a 51 y.o. male present for diabetes follow up. Essential hypertension, benign - Uncertain what he is taking with his medications. He is a very poor historian surrounding medications and symptoms etc.  - Refer to cardiologist for stress test/eval of CP. Pt encouraged if he has CP lasting more than 3 minutes to take nitro, and call ems.   Hyperlipidemia:  - check next visit.  - take zocor daily. High fiber, low saturated fat diet encouraged. Exercise.   diabetes mellitus (HCC) - Continue metformin 1000 mg QD - Prevnar administered today, PSV 23 in 1 year.  - Pt on Acei - encouraged daily baby ASA - Eye exam: 11/2015--> records requested. [facility] eye doctor) - Foot exam: 11/27/2015 - HgB A1c 5.9 again today.  - declined flu shot, encouraged yearly.    Return in about 3 months (around 11/13/2016), or Dm/HTN/HLD.  Electronically signed by: Felix Pacini, DO [facility] [place]

## 2016-08-19 ENCOUNTER — Other Ambulatory Visit: Payer: Self-pay | Admitting: Family Medicine

## 2016-08-19 DIAGNOSIS — E119 Type 2 diabetes mellitus without complications: Secondary | ICD-10-CM

## 2016-09-13 ENCOUNTER — Ambulatory Visit: Payer: BLUE CROSS/BLUE SHIELD | Admitting: Cardiology

## 2016-09-23 ENCOUNTER — Ambulatory Visit: Payer: BLUE CROSS/BLUE SHIELD | Admitting: Cardiology

## 2016-10-01 ENCOUNTER — Encounter: Payer: Self-pay | Admitting: Cardiology

## 2016-11-08 ENCOUNTER — Telehealth: Payer: Self-pay | Admitting: Family Medicine

## 2016-11-08 ENCOUNTER — Other Ambulatory Visit: Payer: Self-pay | Admitting: *Deleted

## 2016-11-08 DIAGNOSIS — I10 Essential (primary) hypertension: Secondary | ICD-10-CM

## 2016-11-08 DIAGNOSIS — E119 Type 2 diabetes mellitus without complications: Secondary | ICD-10-CM

## 2016-11-08 MED ORDER — LISINOPRIL-HYDROCHLOROTHIAZIDE 20-12.5 MG PO TABS: ORAL_TABLET | ORAL | 0 refills | Status: DC

## 2016-11-08 MED ORDER — METFORMIN HCL ER 500 MG PO TB24: 1000.0000 mg | ORAL_TABLET | Freq: Every day | ORAL | 0 refills | Status: DC

## 2016-11-08 NOTE — Telephone Encounter (Signed)
Metformin & Lisinopril Rx to [facility]. Patient is requesting early refill since he is losing his insurance due to job change.

## 2016-11-08 NOTE — Telephone Encounter (Signed)
Medication has been refilled left message for patient.

## 2016-11-13 ENCOUNTER — Ambulatory Visit: Payer: BLUE CROSS/BLUE SHIELD | Admitting: Family Medicine

## 2016-12-04 ENCOUNTER — Ambulatory Visit: Payer: BLUE CROSS/BLUE SHIELD | Admitting: Family Medicine

## 2017-02-07 ENCOUNTER — Other Ambulatory Visit: Payer: Self-pay | Admitting: *Deleted

## 2017-02-07 DIAGNOSIS — I10 Essential (primary) hypertension: Secondary | ICD-10-CM

## 2017-02-07 MED ORDER — LISINOPRIL-HYDROCHLOROTHIAZIDE 20-12.5 MG PO TABS: ORAL_TABLET | ORAL | 0 refills | Status: DC

## 2017-03-20 ENCOUNTER — Other Ambulatory Visit: Payer: Self-pay | Admitting: *Deleted

## 2017-03-20 DIAGNOSIS — I10 Essential (primary) hypertension: Secondary | ICD-10-CM

## 2017-03-20 MED ORDER — LISINOPRIL-HYDROCHLOROTHIAZIDE 20-12.5 MG PO TABS: ORAL_TABLET | ORAL | 0 refills | Status: DC

## 2017-05-14 ENCOUNTER — Other Ambulatory Visit: Payer: Self-pay | Admitting: Family Medicine

## 2017-05-14 DIAGNOSIS — I10 Essential (primary) hypertension: Secondary | ICD-10-CM

## 2017-05-14 NOTE — Telephone Encounter (Signed)
Dr. Kuneff pt.  

## 2017-05-14 NOTE — Telephone Encounter (Signed)
Patient past due for follow up on BP . Sent in 14 day supply to pharmacy.tried to notify patient unable to leave message voice mail not set up.

## 2017-05-19 ENCOUNTER — Other Ambulatory Visit: Payer: Self-pay | Admitting: *Deleted

## 2017-05-19 DIAGNOSIS — E119 Type 2 diabetes mellitus without complications: Secondary | ICD-10-CM

## 2017-05-19 MED ORDER — METFORMIN HCL ER 500 MG PO TB24: 1000.0000 mg | ORAL_TABLET | Freq: Every day | ORAL | 0 refills | Status: DC

## 2017-05-19 NOTE — Telephone Encounter (Signed)
Metformin 30 day refill only patient needs appt prior to anymore refills. Left message for patient

## 2017-07-11 ENCOUNTER — Other Ambulatory Visit: Payer: Self-pay | Admitting: *Deleted

## 2017-07-11 DIAGNOSIS — E119 Type 2 diabetes mellitus without complications: Secondary | ICD-10-CM

## 2017-07-11 MED ORDER — METFORMIN HCL ER 500 MG PO TB24: 1000.0000 mg | ORAL_TABLET | Freq: Every day | ORAL | 0 refills | Status: DC

## 2017-07-11 NOTE — Telephone Encounter (Signed)
Left message for patient he needs appt prior to anymore refills on medication. Metformin 14 day supply sent.

## 2017-10-03 ENCOUNTER — Ambulatory Visit: Payer: BLUE CROSS/BLUE SHIELD | Admitting: Family Medicine

## 2017-10-03 ENCOUNTER — Encounter: Payer: Self-pay | Admitting: Family Medicine

## 2017-10-03 ENCOUNTER — Ambulatory Visit: Payer: 59 | Admitting: Family Medicine

## 2017-10-03 VITALS — BP 129/75 | HR 82 | Temp 98.5°F | Ht 69.0 in | Wt 204.8 lb

## 2017-10-03 DIAGNOSIS — I10 Essential (primary) hypertension: Secondary | ICD-10-CM | POA: Diagnosis not present

## 2017-10-03 DIAGNOSIS — I2510 Atherosclerotic heart disease of native coronary artery without angina pectoris: Secondary | ICD-10-CM | POA: Diagnosis not present

## 2017-10-03 DIAGNOSIS — Z23 Encounter for immunization: Secondary | ICD-10-CM

## 2017-10-03 DIAGNOSIS — E785 Hyperlipidemia, unspecified: Secondary | ICD-10-CM | POA: Diagnosis not present

## 2017-10-03 DIAGNOSIS — E119 Type 2 diabetes mellitus without complications: Secondary | ICD-10-CM

## 2017-10-03 LAB — CBC WITH DIFFERENTIAL/PLATELET
Basophils Absolute: 0 10*3/uL (ref 0.0–0.1)
Basophils Relative: 0.8 % (ref 0.0–3.0)
EOS ABS: 0.1 10*3/uL (ref 0.0–0.7)
Eosinophils Relative: 2.4 % (ref 0.0–5.0)
HCT: 47.2 % (ref 39.0–52.0)
Hemoglobin: 16.2 g/dL (ref 13.0–17.0)
LYMPHS ABS: 1.7 10*3/uL (ref 0.7–4.0)
Lymphocytes Relative: 28.1 % (ref 12.0–46.0)
MCHC: 34.4 g/dL (ref 30.0–36.0)
MCV: 90.2 fl (ref 78.0–100.0)
MONO ABS: 0.6 10*3/uL (ref 0.1–1.0)
Monocytes Relative: 10.3 % (ref 3.0–12.0)
NEUTROS PCT: 58.4 % (ref 43.0–77.0)
Neutro Abs: 3.6 10*3/uL (ref 1.4–7.7)
Platelets: 223 10*3/uL (ref 150.0–400.0)
RBC: 5.24 Mil/uL (ref 4.22–5.81)
RDW: 13 % (ref 11.5–15.5)
WBC: 6.1 10*3/uL (ref 4.0–10.5)

## 2017-10-03 LAB — COMPREHENSIVE METABOLIC PANEL
ALK PHOS: 47 U/L (ref 39–117)
ALT: 36 U/L (ref 0–53)
AST: 22 U/L (ref 0–37)
Albumin: 4.8 g/dL (ref 3.5–5.2)
BUN: 10 mg/dL (ref 6–23)
CHLORIDE: 98 meq/L (ref 96–112)
CO2: 26 mEq/L (ref 19–32)
Calcium: 10.6 mg/dL — ABNORMAL HIGH (ref 8.4–10.5)
Creatinine, Ser: 0.9 mg/dL (ref 0.40–1.50)
GFR: 93.44 mL/min (ref >60.00)
GLUCOSE: 120 mg/dL — AB (ref 70–99)
Potassium: 4.4 mEq/L (ref 3.5–5.1)
SODIUM: 136 meq/L (ref 135–145)
TOTAL PROTEIN: 8 g/dL (ref 6.0–8.3)
Total Bilirubin: 0.5 mg/dL (ref 0.2–1.2)

## 2017-10-03 LAB — TSH: TSH: 1.3 u[IU]/mL (ref 0.35–4.50)

## 2017-10-03 LAB — MICROALBUMIN / CREATININE URINE RATIO
CREATININE, U: 71.9 mg/dL
MICROALB UR: 0.8 mg/dL (ref 0.0–1.9)
MICROALB/CREAT RATIO: 1.1 mg/g (ref 0.0–30.0)

## 2017-10-03 LAB — LDL CHOLESTEROL, DIRECT: LDL DIRECT: 167 mg/dL

## 2017-10-03 LAB — POCT GLYCOSYLATED HEMOGLOBIN (HGB A1C): HEMOGLOBIN A1C: 6.2

## 2017-10-03 MED ORDER — SIMVASTATIN 20 MG PO TABS: 20.0000 mg | ORAL_TABLET | Freq: Every day | ORAL | 3 refills | Status: DC

## 2017-10-03 MED ORDER — METFORMIN HCL ER 500 MG PO TB24: 1000.0000 mg | ORAL_TABLET | Freq: Every day | ORAL | 1 refills | Status: DC

## 2017-10-03 MED ORDER — NITROGLYCERIN 0.4 MG SL SUBL: 0.4000 mg | SUBLINGUAL_TABLET | SUBLINGUAL | 0 refills | Status: DC | PRN

## 2017-10-03 MED ORDER — LISINOPRIL-HYDROCHLOROTHIAZIDE 20-12.5 MG PO TABS: ORAL_TABLET | ORAL | 1 refills | Status: DC

## 2017-10-03 MED ORDER — LISINOPRIL-HYDROCHLOROTHIAZIDE 20-12.5 MG PO TABS: 1.0000 | ORAL_TABLET | Freq: Every day | ORAL | 1 refills | Status: DC

## 2017-10-03 NOTE — Progress Notes (Signed)
Patient ID: Stephen Lucero, male  DOB: 01/28/1966, 52 y.o.   MRN: [id number] Patient Care Team    Relationship Specialty Notifications Start End  Natalia Leatherwood, DO PCP - General Family Medicine  08/22/15     Subjective:  Stephen Lucero is a 52 y.o. male present for diabetes/HTN follow up. All past medical history, surgical history, allergies, family history, immunizations, medications and social history were updated in the electronic medical record today. All recent labs, ED visits and hospitalizations within the last year were reviewed.  Essential hypertension, benign/Hyperlipidemia: pt has only been taking 1- lisinpril/HCTZ AGAIN. Blood pressures ranges at home not checked. Patient denies chest pain, shortness of breath or lower extremity edema. Pt does not  daily baby ASA. Pt is  prescribed statin. He has a fhx of heart disease.He has had studies completed in 2012. BMP: 04/24/2016 __> GFR 97 CBC: 04/24/2016 --> WNL Lipid: total 233, HDL 51, LDL 165, Tg 86 Diet: low sodium, but does not monitor Exercise: Not routinely.  RF: HTN, HLD, OBESITY, DM   Diabetes: Pt reports compliance with metformin QD (prescribed BID). Denies numbness, tingling of extremities, hypo/hyperglycemic events or non-healing wounds. Pt reports BG ranges are not routinely checked.  PNA series: completed today 10/03/2017 Flu shot: declined (recommneded yearly) BMP: 04/24/2016 --> WNL Foot exam: completed today 10/03/2017 Eye exam: *overdue, reports he is making appt within the next month and will have records forwarded.  A1c:   EKG 04/2011 Ventricular Rate 95BPM  Atrial Rate95BPM  P-R Interval 162 ms QRS Duration 82ms Q-T Interval 350 ms QTC439  ms P Axis 58degrees  R Axis -21 degrees  T Axis 51degrees  Sinus rhythm Normal ECG Echocardiogram 02/2011 Normal left ventricular size Mild left ventricular concentric hypertrophy Preserved left ventricular systolic function No segmental wall motion abnormalities Normal right ventricular size Normal right ventricular function Dilated left atrium No significant valve stenosis No significant valvular regurgitation No pericardial effusion No old study for comparison Small 1 cm diameter mass protruding from the interatrial septum  into the right atrium.Uncertain etiology.This may represent  unusual lipomatous hypertrophy, but cannot exclude myxoma. Suggest cardiac MRI for further clarification. I have personally reviewed and interpreted this echocardiogram.  [facility] MR left heart INTERPRETATION 1. No evidence of a right atrial mass. Significant lipomatous hypertrophy of the interatrial septum, which accounts for the abnormality seen on echocardiography.2. Normal LV size and systolic function, EF is 69%.3. No evidence of myocardial hyperenhancement suggestive of fibrosis or infarction. CARDIAC CATHETERIZATION PROCEDURE(S): 02/2011 Left heart cath, Coronary Angiography and Left Ventriculogram (40981)  STUDY INDICATION(S): Unstable Angina (411.1) APPROACH: Approach 1: Percutaneous right radial artery Sheath: 284F CATHETERS: 284F Tiger Radial 4.0, 284F Pig Infiniti, 284F JR4 Impulse Diagnostic  Catheter HEMODYNAMIC DATA:  Height: 67 in.  Weight: 207 lbs. LEFT HEART CATHETERIZATION PROTOCOL PRESSURES:  SiteBaseline Values, mm HgS/P Angiography, mm Hg  S/D Mean  S/DMean  AO 139 / 1152 OTHER HEMODYNAMIC INFORMATION:  Heart Rate:65  CARDIOVASCULAR ANGIOGRAPHY LEFT VENTRICULOGRAPHY:  Ejection Fraction: 60%  LV Chamber Size: Normal  Regional Wall Motion:Normal  CORONARY ANGIOGRAPHY: Vessel SegmentVessel TypeStenosis (%)DescriptionComment Proximal LADNative 50 3rd DiagonalNative 50 Ramus Present: No Coronary Dominance:Right CONCLUSIONS:  CORONARY STATUS: Moderate non-obstructive ASCAD  LV FUNCTION:  Ejection Fraction: 60%  Wall Motion: Normal   OTHER:LM nl LAD prox 50%, Diag 50% LCX nl RCA nl    diabetes mellitus (HCC) patient is tolerating metformin  Tolerating metformin 1000 mg QD. A1c  Started  at 8.7, now has trended to 5.9 with metformin use. He does not desire to check blood sugars. He denies nonhealing wounds, numbness tingling in his extremities.  He reports one episode of feeling weak and his friend took his BG and it was "low". He ate a piece of candy and resolved.  - declined his flu shot. Encouraged yearly - Prevnar administered 04/24/2016, PSV 23 in 1 year.  - Pt on Acei - Eye exam: 11/2015--> records requested Again.  - Foot exam: 11/27/2015 - BMP yearly; last 04/2016 normal.   Immunization History  Administered Date(s) Administered  . Pneumococcal Conjugate-13 04/24/2016  . Tdap 04/24/2016    Past Medical History:  Diagnosis Date  . Allergy   . Diabetes mellitus without complication (HCC)   . GERD (gastroesophageal reflux disease)   . High cholesterol   . Hypertension    Allergies  Allergen Reactions  . Lipitor [Atorvastatin] Other (See Comments)    mylagia  . Shellfish-Derived Products Other (See Comments)    oysters   Past Surgical  History:  Procedure Laterality Date  . CARDIAC CATHETERIZATION     Family History  Problem Relation Age of Onset  . Alcohol abuse Father   . Cirrhosis Father   . Early death Father [id number]       cirrhosis   . Cancer Maternal Aunt   . Heart disease Paternal Grandfather   . Hyperlipidemia Paternal Grandfather   . Hypertension Paternal Grandfather    Social History   Socioeconomic History  . Marital status: Single    Spouse name: Not on file  . Number of children: Not on file  . Years of education: Not on file  . Highest education level: Not on file  Occupational History  . Not on file  Social Needs  . Financial resource strain: Not on file  . Food insecurity:    Worry: Not on file    Inability: Not on file  . Transportation needs:    Medical: Not on file    Non-medical: Not on file  Tobacco Use  . Smoking status: Never Smoker  . Smokeless tobacco: Never Used  Substance and Sexual Activity  . Alcohol use: Yes    Alcohol/week: 7.2 oz    Types: 12 Cans of beer per week    Comment: Occasionaly on weekends  . Drug use: No  . Sexual activity: Yes  Lifestyle  . Physical activity:    Days per week: Not on file    Minutes per session: Not on file  . Stress: Not on file  Relationships  . Social connections:    Talks on phone: Not on file    Gets together: Not on file    Attends religious service: Not on file    Active member of club or organization: Not on file    Attends meetings of clubs or organizations: Not on file    Relationship status: Not on file  . Intimate partner violence:    Fear of current or ex partner: Not on file    Emotionally abused: Not on file    Physically abused: Not on file    Forced sexual activity: Not on file  Other Topics Concern  . Not on file  Social History Narrative   Single. No children.   11th grade education.    Wears seatbelt   Regular diet.    Smoke detector in the home.    Feels safe in relationship.    No firearms in the home.  Allergies as of 10/03/2017      Reactions   Lipitor [atorvastatin] Other (See Comments)   mylagia   Shellfish-derived Products Other (See Comments)   oysters      Medication List        Accurate as of 10/03/17 10:03 AM. Always use your most recent med list.          lisinopril-hydrochlorothiazide 20-12.5 MG tablet Commonly known as:  PRINZIDE,ZESTORETIC Take 1 tablet by mouth daily.   metFORMIN 500 MG 24 hr tablet Commonly known as:  GLUCOPHAGE-XR Take 2 tablets (1,000 mg total) by mouth daily. Needs office visit prior to anymore refills.   nitroGLYCERIN 0.4 MG SL tablet Commonly known as:  NITROSTAT Place 1 tablet (0.4 mg total) under the tongue every 5 (five) minutes as needed for chest pain. Reported on 11/27/2015   simvastatin 20 MG tablet Commonly known as:  ZOCOR Take 1 tablet (20 mg total) by mouth at bedtime.        Recent Results (from the past 2160 hour(s))  POCT HgB A1C     Status: Abnormal   Collection Time: 10/03/17  9:51 AM  Result Value Ref Range   Hemoglobin A1C 6.2     ROS: 14 pt review of systems performed and negative (unless mentioned in an HPI)  Objective: BP 129/75 (BP Location: Left Arm, Patient Position: Sitting, Cuff Size: Normal)   Pulse 82   Temp 98.5 F (36.9 C) (Oral)   Ht 5\' 9"  (1.753 m)   Wt 204 lb 12.8 oz (92.9 kg)   SpO2 96%   BMI 30.24 kg/m  Gen: Afebrile. No acute distress. Nontoxic in appearance. Obese. Flushed, caucasian male.  HENT: AT. [place]. Bilateral TM visualized and normal in appearance. MMM. Throat without erythema or exudates. No cough or hoarseness.  Eyes:Pupils Equal Round Reactive to light, Extraocular movements intact,  Conjunctiva without redness, discharge or icterus. Neck/lymp/endocrine: Supple,no lymphadenopathy, no thyromegaly CV: RRR no murmur, no edema, +2/4 P posterior tibialis pulses Chest: CTAB, no wheeze or crackles Abd: Soft. NTND. BS present. no Masses palpated.  Neuro:  Normal gait. PERLA.  EOMi. Alert. Oriented X3  Psych: Normal affect, dress and demeanor. Normal speech. Normal thought content and judgment..  Diabetic Foot Exam - Simple   Simple Foot Form Diabetic Foot exam was performed with the following findings:  Yes 10/03/2017 12:43 PM  Visual Inspection No deformities, no ulcerations, no other skin breakdown bilaterally:  Yes Sensation Testing Intact to touch and monofilament testing bilaterally:  Yes Pulse Check Posterior Tibialis and Dorsalis pulse intact bilaterally:  Yes Comments       Assessment/plan: Osualdo Hansell is a 52 y.o. male present for diabetes follow up. Essential hypertension, benign/HLD - controlled.  - low sodium. Exercise encouraged.  - Refills on Lisinopril 20-12.5 QD. Changed script to reflect once a day and decreased amount. If BP elevated will increase pill dose (he will only take one) - ASA 81 mg QD.  - continue zocor 20 mg QD.  - CBC, CMP, LDL, TSH collected today. He never comes fasting and will not return fasting for future labs.  - 6 month follow up (no refills until f/u unless VERY short course to get to appt) .   diabetes mellitus (HCC) - controlled - Continue metformin 1000 mg QD--> encouraged to take correctly.  - Prevnar 10/11/201, [id number] 10/03/2017 - Pt on Acei - encouraged daily baby ASA - Eye exam: 11/2015--> records requested. [facility] eye doctor). He is scheduling.  - Foot  exam: 10/03/2017 - HgB A1c 5.9 --> 6.2 today - declined flu shot, encouraged yearly.  - F/U 6 months.    Return in about 6 months (around 04/05/2018) for HTN/prediab.  Electronically signed by: Felix Pacini, DO [facility] [place]

## 2017-10-03 NOTE — Patient Instructions (Addendum)
Have your eye doctor send us a copy of their notes.   Follow up every 6 months on chronic medical conditions. Sooner if illness.   I have refilled your medications today.  We will call you with all your lab results.  Diabetes: 6.2 today. This is good control. Continue metformin and diet/exercise.    Please help us help you:  We are honored [facility]. Below you will find basic instructions that you may need to access in the future. Please help us help you by reading the instructions, which cover many of the frequent questions we experience.   Prescription refills and request:  -In order to allow more efficient response time, please call your pharmacy for all refills. They will forward the request electronically to us. This allows for the quickest possible response. Request left on a nurse line can take longer to refill, since these are checked as time allows between office patients and other phone calls.  - refill request can take up to 3-5 working days to complete.  - If request is sent electronically and request is appropiate, it is usually completed in 1-2 business days.  - all patients will need to be seen routinely for all chronic medical conditions requiring prescription medications (see follow-up below). If you are overdue for follow up on your condition, you will be asked to make an appointment and we will call in enough medication to cover you until your appointment (up to 30 days).  - all controlled substances will require a face to face visit to request/refill.  - if you desire your prescriptions to go through a new pharmacy, and have an active script at original pharmacy, you will need to call your pharmacy and have scripts transferred to new pharmacy. This is completed between the pharmacy locations and not by your provider.    Results: If any images or labs were ordered, it can take up to 1 week to get results depending on the test  ordered and the lab/facility running and resulting the test. - Normal or stable results, which do not need further discussion, may be released to your mychart immediately with attached note to you. A call may not be generated for normal results. Please make certain to sign up for mychart. If you have questions on how to activate your mychart you can call the front office.  - If your results need further discussion, our office will attempt to contact you via phone, and if unable to reach you after 2 attempts, we will release your abnormal result to your mychart with instructions.  - All results will be automatically released in mychart after 1 week.  - Your provider will provide you with explanation and instruction on all relevant material in your results. Please keep in mind, results and labs may appear confusing or abnormal to the untrained eye, but it does not mean they are actually abnormal for you personally. If you have any questions about your results that are not covered, or you desire more detailed explanation than what was provided, you should make an appointment with your provider to do so.   Our office handles many outgoing and incoming calls daily. If we have not contacted you within 1 week about your results, please check your mychart to see if there is a message first and if not, then contact our office.  In helping with this matter, you help decrease call volume, and therefore allow us to  be able to respond to patients needs more efficiently.   Acute office visits (sick visit):  An acute visit is intended for a new problem and are scheduled in shorter time slots to allow schedule openings for patients with new problems. This is the appropriate visit to discuss a new problem. In order to provide you with excellent quality medical care with proper time for you to explain your problem, have an exam and receive treatment with instructions, these appointments should be limited to one new problem  per visit. If you experience a new problem, in which you desire to be addressed, please make an acute office visit, we save openings on the schedule to accommodate you. Please do not save your new problem for any other type of visit, let us take care of it properly and quickly for you.   Follow up visits:  Depending on your condition(s) your provider will need to see you routinely in order to provide you with quality care and prescribe medication(s). Most chronic conditions (Example: hypertension, Diabetes, depression/anxiety... etc), require visits a couple times a year. Your provider will instruct you on proper follow up for your personal medical conditions and history. Please make certain to make follow up appointments for your condition as instructed. Failing to do so could result in lapse in your medication treatment/refills. If you request a refill, and are overdue to be seen on a condition, we will always provide you with a 30 day script (once) to allow you time to schedule.    Medicare wellness (well visit): - we have a wonderful Nurse Maudie Mercury), that will meet with you and provide you will yearly medicare wellness visits. These visits should occur yearly (can not be scheduled less than 1 calendar year apart) and cover preventive health, immunizations, advance directives and screenings you are entitled to yearly through your medicare benefits. Do not miss out on your entitled benefits, this is when medicare will pay for these benefits to be ordered for you.  These are strongly encouraged by your provider and is the appropriate type of visit to make certain you are up to date with all preventive health benefits. If you have not had your medicare wellness exam in the last 12 months, please make certain to schedule one by calling the office and schedule your medicare wellness with Maudie Mercury as soon as possible.   Yearly physical (well visit):  - Adults are recommended to be seen yearly for physicals. Check with  your insurance and date of your last physical, most insurances require one calendar year between physicals. Physicals include all preventive health topics, screenings, medical exam and labs that are appropriate for gender/age and history. You may have fasting labs needed at this visit. This is a well visit (not a sick visit), new problems should not be covered during this visit (see acute visit).  - Pediatric patients are seen more frequently when they are younger. Your provider will advise you on well child visit timing that is appropriate for your their age. - This is not a medicare wellness visit. Medicare wellness exams do not have an exam portion to the visit. Some medicare companies allow for a physical, some do not allow a yearly physical. If your medicare allows a yearly physical you can schedule the medicare wellness with our nurse Maudie Mercury and have your physical with your provider after, on the same day. Please check with insurance for your full benefits.   Late Policy/No Shows:  - all new patients should arrive  15-30 minutes earlier than appointment to allow [place] time  to  obtain all personal demographics,  insurance information and for you to complete office paperwork. - All established patients should arrive 10-15 minutes earlier than appointment time to update all information and be checked in .  - In our best efforts to run on time, if you are late for your appointment you will be asked to either reschedule or if able, we will work you back into the schedule. There will be a wait time to work you back in the schedule,  depending on availability.  - If you are unable to make it to your appointment as scheduled, please call 24 hours ahead of time to allow [place] to fill the time slot with someone else who needs to be seen. If you do not cancel your appointment ahead of time, you may be charged a no show fee.

## 2017-10-06 ENCOUNTER — Telehealth: Payer: Self-pay | Admitting: Family Medicine

## 2017-10-06 DIAGNOSIS — E785 Hyperlipidemia, unspecified: Secondary | ICD-10-CM

## 2017-10-06 HISTORY — DX: Hypercalcemia: E83.52

## 2017-10-06 MED ORDER — SIMVASTATIN 40 MG PO TABS: 40.0000 mg | ORAL_TABLET | Freq: Every day | ORAL | 3 refills | Status: DC

## 2017-10-06 NOTE — Telephone Encounter (Signed)
Please inform patient the following information: Inform pt his cholesterol is higher than desired 167. I have increased his cholesterol dose from zocor 20--> 40 mg. He can take 2 of the pills he has currently, new script is 40 mg (so then take 1).  He should also take a daily baby asa if he is not already.  His calcium is mildly elevated, this is new for him. Again, very mildly elevated and could be from dehydration or many other causes. We will recheck this level at his next appt in 6 months--> make sure he has one scheduled for his [facility].

## 2017-10-06 NOTE — Telephone Encounter (Signed)
Tried to contact patient unable to leave message voice mail not set up 

## 2017-10-07 NOTE — Telephone Encounter (Signed)
Spoke with patient reviewed lab results and instructions. Patient verbalized understanding. 

## 2018-04-06 ENCOUNTER — Ambulatory Visit: Payer: 59 | Admitting: Family Medicine

## 2018-04-17 ENCOUNTER — Ambulatory Visit: Payer: 59 | Admitting: Family Medicine

## 2018-04-17 DIAGNOSIS — Z0289 Encounter for other administrative examinations: Secondary | ICD-10-CM

## 2018-05-01 ENCOUNTER — Ambulatory Visit: Payer: Self-pay | Admitting: Family Medicine

## 2018-05-01 DIAGNOSIS — Z0289 Encounter for other administrative examinations: Secondary | ICD-10-CM

## 2018-06-29 ENCOUNTER — Telehealth: Payer: Self-pay

## 2018-06-29 ENCOUNTER — Other Ambulatory Visit: Payer: Self-pay

## 2018-06-29 DIAGNOSIS — E119 Type 2 diabetes mellitus without complications: Secondary | ICD-10-CM

## 2018-06-29 MED ORDER — METFORMIN HCL ER 500 MG PO TB24: 1000.0000 mg | ORAL_TABLET | Freq: Every day | ORAL | 0 refills | Status: DC

## 2018-06-29 NOTE — Telephone Encounter (Signed)
Medication request for Metformin. Refilled for 1 month, No additional refills pt needs a FU appt prior to anymore rfs.

## 2018-10-07 ENCOUNTER — Other Ambulatory Visit: Payer: Self-pay | Admitting: Family Medicine

## 2018-10-07 DIAGNOSIS — I10 Essential (primary) hypertension: Secondary | ICD-10-CM

## 2018-10-08 NOTE — Telephone Encounter (Signed)
Pt was called and appt was made, pt stated he was willing to do video/telephone visit. He did state that money was very tight right now and that hopefully he could afford to come. Pt was scheduled and verbalized understanding about 30 days of med were filled and he would need appt to receive refills.

## 2018-10-08 NOTE — Telephone Encounter (Signed)
Received refill request for his BP medication. Pt has not been seen in over a year with chronic conditions of diabetes, hypertension and hyperlipidemia.   I refilled med for 30 days. He must make an appt within that time for continued refills.  He was told this in December when diabetes meds requested and still has not made follow up.

## 2018-10-21 ENCOUNTER — Encounter: Payer: Self-pay | Admitting: Family Medicine

## 2018-10-21 ENCOUNTER — Other Ambulatory Visit: Payer: Self-pay

## 2018-10-21 ENCOUNTER — Ambulatory Visit (INDEPENDENT_AMBULATORY_CARE_PROVIDER_SITE_OTHER): Payer: Self-pay | Admitting: Family Medicine

## 2018-10-21 VITALS — Ht 68.0 in | Wt 205.0 lb

## 2018-10-21 DIAGNOSIS — I10 Essential (primary) hypertension: Secondary | ICD-10-CM

## 2018-10-21 DIAGNOSIS — E119 Type 2 diabetes mellitus without complications: Secondary | ICD-10-CM

## 2018-10-21 DIAGNOSIS — E785 Hyperlipidemia, unspecified: Secondary | ICD-10-CM

## 2018-10-21 DIAGNOSIS — Z9119 Patient's noncompliance with other medical treatment and regimen: Secondary | ICD-10-CM

## 2018-10-21 DIAGNOSIS — I2510 Atherosclerotic heart disease of native coronary artery without angina pectoris: Secondary | ICD-10-CM

## 2018-10-21 DIAGNOSIS — Z91199 Patient's noncompliance with other medical treatment and regimen due to unspecified reason: Secondary | ICD-10-CM | POA: Insufficient documentation

## 2018-10-21 MED ORDER — LISINOPRIL-HYDROCHLOROTHIAZIDE 20-12.5 MG PO TABS: 1.0000 | ORAL_TABLET | Freq: Every day | ORAL | 5 refills | Status: DC

## 2018-10-21 MED ORDER — NITROGLYCERIN 0.4 MG SL SUBL: 0.4000 mg | SUBLINGUAL_TABLET | SUBLINGUAL | 0 refills | Status: DC | PRN

## 2018-10-21 MED ORDER — SIMVASTATIN 40 MG PO TABS: 40.0000 mg | ORAL_TABLET | Freq: Every day | ORAL | 5 refills | Status: DC

## 2018-10-21 MED ORDER — METFORMIN HCL ER 500 MG PO TB24: 1000.0000 mg | ORAL_TABLET | Freq: Every day | ORAL | 5 refills | Status: DC

## 2018-10-21 NOTE — Progress Notes (Signed)
Virtual Visit via Telephone Note  I connected with Stephen Lucero on 10/21/18 at  8:30 AM EDT by telephone and verified that I am speaking with the correct person using two identifiers.   I discussed the limitations, risks, security and privacy concerns of performing an evaluation and management service by telephone and the availability of in person appointments. I also discussed with the patient that there may be a patient responsible charge related to this service. The patient expressed understanding and agreed to proceed.  Chief Complaint  Patient presents with  . Hypertension    Pt needs refills. Pt complains that if he goes up stairs or carries something heavy that he feels jittery and feels like something is sitting on his chest.   . Diabetes  . Hyperlipidemia     History of Present Illness: Stephen Lucero is a 53 y.o.  Essential hypertension, benign/Hyperlipidemia: Patietn reports compliance with lisinpril/HCTZ. Blood pressures ranges at home not checked. Patient endorses his "chest hurts" in the "top middle" when walking up stairs and lifting heavy objects frequently. He states he rest and/or takes a nitro if needed and it resolves. Patient denies shortness of breath or lower extremity edema. Pt takes a  daily baby ASA. Pt is  prescribed statin but has not been taking it- does not think it is "doing anything." He has a fhx of heart disease. He has had studies completed in 2012. He was referred to cardiology in 08/16/2016 for same symptoms and cancelled appt prior to being seen.  BMP: 09/2017 --> normal.  CBC: 09/2017 --> WNL Lipid:09/2017  total 233, HDL 51, LDL 165, Tg 86 TSH: 09/2017 WNL Diet: low sodium, but does not monitor Exercise: Not routinely.  RF: HTN, HLD, OBESITY, DM  Diabetes: Pt reports compliance with metformin QD (prescribed BID). Denies numbness, tingling of extremities, hypo/hyperglycemic events or non-healing wounds. Pt reports BG ranges are not routinely checked.  PNA  series: completed today 10/03/2017 Flu shot: declined (recommneded yearly) BMP: 04/24/2016 --> WNL Foot exam: completed today 10/03/2017 Eye exam: *overdue, reports he is making appt within the next month and will have records forwarded.   EKG 04/2011 Ventricular Rate 95BPM  Atrial Rate95BPM  P-R Interval 162 ms QRS DurationEncompass Health Rehabilitation [facility] MediSisters Of Ch16HealSainValir RehabilitaHenry Ford M16HColumbus Comm16SelLegent Hospital Mid[facility] Surgi16Northcoast BeGreat FaOdessa RegionalEasterLafayette Regional16Umass MemoTryon En16Digestive [place] EndosFresno Va Medical Center (VaEating Recovery CenteMount Carmel BKindred HospitalThoSpearfish Regional 16ThUmass Memorial Medical Center - Uni16Osi LAsN W Ey16[facility] HoFrances Mah[place] Deac16Saint Thomas Midtown Hospitalxis 58degrees  R Axis -21 degrees  T Axis 51degrees  Sinus rhythm Normal ECG Echocardiogram 02/2011 Normal left ventricular size Mild left ventricular concentric hypertrophy Preserved left ventricular systolic function No segmental wall motion abnormalities Normal right ventricular size Normal right ventricular function Dilated left atrium No significant valve stenosis No significant valvular regurgitation No pericardial effusion No old study for comparison Small 1 cm diameter mass protruding from the interatrial septum  into the right atrium.Uncertain etiology.This may represent  unusual lipomatous hypertrophy, but cannot exclude myxoma. Suggest cardiac MRI for further clarification. I have personally reviewed and interpreted this echocardiogram.  [facility] MR left heart INTERPRETATION 1. No evidence of a right atrial mass. Significant lipomatous hypertrophy of the interatrial septum, which accounts for the abnormality seen on echocardiography.2. Normal LV size and systolic function, EF is 69%.3. No evidence of myocardial hyperenhancement suggestive of  fibrosis or infarction. CARDIAC CATHETERIZATION PROCEDURE(S): 02/2011 Left heart cath, Coronary Angiography and Left Ventriculogram (93458)  STUDY INDICATION(S): Unstable Angina (411.1) APPROACH: Approach 1: Percutaneous right radial artery Sheath: 71F CATHETERS: 71F Tiger Radial 4.0, 71F Pig Infiniti, 71F JR4 Impulse Diagnostic  Catheter HEMODYNAMIC DATA:  Height: 67 in.  Weight: 207 lbs.  LEFT HEART CATHETERIZATION PROTOCOL PRESSURES:  SiteBaseline Values, mm HgS/P Angiography, mm Hg  S/D Mean S/DMean  AO 139 / 1152 OTHER HEMODYNAMIC INFORMATION:  Heart Rate:65  CARDIOVASCULAR ANGIOGRAPHY LEFT VENTRICULOGRAPHY:  Ejection Fraction: 60%  LV Chamber Size: Normal  Regional Wall Motion:Normal  CORONARY ANGIOGRAPHY: Vessel SegmentVessel TypeStenosis (%)DescriptionComment Proximal LADNative 50 3rd DiagonalNative 50 Ramus Present: No Coronary Dominance:Right CONCLUSIONS:  CORONARY STATUS: Moderate non-obstructive ASCAD  LV FUNCTION:  Ejection Fraction: 60%  Wall Motion: Normal   OTHER:LM nl LAD prox 50%, Diag 50% LCX nl RCA nl   Observations/Objective: Ht 5\' 8"  (1.727 m)   Wt 205 lb (93 kg)   BMI 31.17 kg/m  Gen:  No acute distress.  Chest: no cough or shortness of breath. Neuro: Alert. Oriented x3  Psych: Normal affect, Normal speech.   Assessment and Plan: Stephen Lucero is a 40 y.o. present via telephone with  Essential hypertension, benign/HLD/chest pain with exertion/noncompliance - He needs scheduled for BP check at tent. He is aware and we discussed setting this up and then he hung before scheduling.   - he needs to be evaluated by cardiology--> he has been referred, and declined when scheduling. He again complains of chest pain with exertion and again declines referral to cardiology. Discussed these are warning signs of heart disease and to seek emergent medical attention if pain is  Not resolved with rest or nitro.  - He needs to see cardiology. - low sodium. Exercise encouraged.  - Refills provided on Lisinopril 20-12.5 QD.  - ASA 81 mg QD.  - continue zocor 20 mg QD.  - labs overdue- however pt is currently without insurance.  - 6 month follow up (no refills until f/u)  diabetes mellitus (HCC)- noncompliance - does not check his sugars. - Continue metformin 1000 mg QD, refills provided today - Prevnar 10/11/201, PNA23 10/03/2017 - Pt on Acei - encouraged daily baby ASA - Eye exam: 11/2015--> records requested. [facility] eye doctor). He is scheduling--> financial reasons can not complete - Foot exam: 10/03/2017--> overdue--> pt unable to complete virtual visit - HgB A1c 5.9 --> 6.2 09/2017--> noncompliant on follow ups.  - F/U 6 months- no refills without appt and labs.   Follow Up Instructions: F/U 6 months with provider. BP check next week at tent. If pt does not present for follow up he will be dismissed for noncompliance and no further scripts will be provided. He hung up when trying to schedule his BP check and did not pick up the phone on call back. He has been noncompliant with management of conditions and appointments since he established.  He was provided with information of [facility] program to look into since he no longer has insurance.     I discussed the assessment and treatment plan with the patient. The patient was provided an opportunity to ask questions and all were answered. The patient agreed with the plan and demonstrated an understanding of the instructions.   The patient was advised to call back or seek an in-person evaluation if the symptoms  worsen or if the condition fails to improve as anticipated.  I provided  25 minutes of non-face-to-face time during this encounter.   Felix Pacini, DO

## 2018-10-21 NOTE — Patient Instructions (Signed)
Refilled meds.  Must be seen at BP tent for check.   Heart Attack A heart attack (myocardial infarction, MI) is when blood and oxygen supply to the heart is cut off. A heart attack causes damage to the heart that cannot be fixed. If you think you are having a heart attack, do not wait to see if the symptoms will go away. Get medical help right away. What are the signs or symptoms? Symptoms of this condition include:  Chest pain. It may feel like: ? Crushing or squeezing. ? Tightness, pressure, fullness, or heaviness.  Pain in the arm, neck, jaw, back, or upper body.  Shortness of breath.  Heartburn.  Indigestion.  Nausea.  Cold sweats.  Feeling tired.  Sudden lightheadedness. Follow these instructions at home: Medicines  Take over-the-counter and prescription medicines only as told by your doctor. You may need to take medicine: ? To keep your blood from clotting too easily. ? To control blood pressure. ? To lower cholesterol. ? To control heart rhythms.  Do not take these medicines unless your doctor says it is okay: ? NSAIDs. ? Supplements that have vitamin A, vitamin E, or both. ? Hormone replacement therapy that has estrogen with or without progestin. Lifestyle   Do not use any products that have nicotine or tobacco. This includes cigarettes and e-cigarettes. If you need help quitting, ask your doctor.  Avoid secondhand smoke.  Exercise regularly. Ask your doctor about a cardiac rehab program.  Eat heart-healthy foods. A diet and nutrition specialist (registered dietitian) can help you form healthy eating habits.  Stay at a healthy weight.  Lower your stress level.  Do not use illegal drugs. Alcohol use  Do not drink alcohol if: ? Your doctor tells you not to drink. ? You are pregnant, may be pregnant, or are planning to become pregnant.  If you drink alcohol, limit how much you have: ? 0-1 drink a day for women. ? 0-2 drinks a day for men.  Know  how much alcohol is in your drink. In the [place] one drink equals one typical bottle of beer (12 oz), one-half glass of wine (5 oz), or one shot of hard liquor (1 oz). General instructions  Work with your doctor to treat other problems you have, like diabetes.  Get screened for depression, and get treatment if needed.  Keep your vaccinations up to date. Get the flu shot (influenza vaccination) every year.  Keep all follow-up visits as told by your doctor. This is important. Contact a doctor if:  You feel sad.  You have trouble doing your daily activities. Get help right away if you:  Have sudden, unexplained discomfort in your: ? Chest. ? Arms. ? Back. ? Neck. ? Jaw. ? Upper body.  Have shortness of breath.  Have sudden sweating or clammy skin.  Feel sick to your stomach (nauseous).  Throw up (vomit).  Suddenly get lightheaded or dizzy.  Feel your heart beating fast.  Feel your heart skipping beats.  Have blood pressure that is higher than 180/120. These symptoms may be an emergency. Do not wait to see if the symptoms will go away. Get medical help right away. Call your local emergency services (911 in the [place] Do not drive yourself to the hospital. Summary  A heart attack is when blood and oxygen supply to the heart is cut off.  You should not take NSAIDs unless your doctor says it is okay.  Do not smoke. Avoid secondhand smoke.  Exercise regularly.  Ask your doctor about a cardiac rehab program. This information is not intended to replace advice given to you by your health care provider. Make sure you discuss any questions you have with your health care provider. Document Released: 12/31/2011 Document Revised: 03/25/2018 Document Reviewed: 08/15/2017 Elsevier Interactive Patient Education  2019 [employer].

## 2018-10-22 ENCOUNTER — Ambulatory Visit: Payer: Self-pay | Admitting: Family Medicine

## 2018-11-02 ENCOUNTER — Ambulatory Visit (INDEPENDENT_AMBULATORY_CARE_PROVIDER_SITE_OTHER): Payer: Self-pay

## 2018-11-02 ENCOUNTER — Other Ambulatory Visit: Payer: Self-pay

## 2018-11-02 VITALS — BP 167/89 | HR 105

## 2018-11-02 DIAGNOSIS — I10 Essential (primary) hypertension: Secondary | ICD-10-CM

## 2018-11-02 MED ORDER — AMLODIPINE BESYLATE 10 MG PO TABS: 10.0000 mg | ORAL_TABLET | Freq: Every day | ORAL | 5 refills | Status: DC

## 2018-11-02 NOTE — Addendum Note (Signed)
Addended by: Felix Pacini A on: 11/02/2018 11:27 AM   Modules accepted: Orders, Level of Service

## 2018-11-02 NOTE — Progress Notes (Addendum)
  Stephen Lucero is a 53 y.o. male presents to the office today for Blood pressure check secondary to Virtual office visit/HTN  Blood pressure medication: Lisinopril-HCTZ 20-12.5mg  1 tab daily each morning   Last dose was at least 2 hours prior to recheck: yes, 6am  Blood pressure was taken in the Left arm after patient rested for 10 minutes.  167/89, 106 pulse 0835 Recheck 0845, L arm, 169/95 BP, 105 pulse   Daryel November LPN   Please advise  ----------------------------------------------------------------------------------------------- Inform patient I have reviewed his blood pressure readings.  It is still rather high.  Continue the lisinopril 20-12.5 mg prescribed.  Added amlodipine 10 mg daily to his regimen today.  Please have him take both blood pressure medications daily.   Follow-up in 2 weeks for BP recheck at tent  Electronically Signed by: Felix Pacini, DO [facility]- OR

## 2018-11-20 ENCOUNTER — Telehealth: Payer: Self-pay | Admitting: Family Medicine

## 2018-11-20 NOTE — Telephone Encounter (Signed)
Copied from CRM [id number]. Topic: Quick Communication - Rx Refill/Question >> Nov 20, 2018  3:25 PM Fanny Bien wrote: Medication: amLODipine (NORVASC) 10 MG tablet [[id number]]  pt states that this low dosage is not working and would like a call back regarding. Pt is feeling very tired. Pt thinks that he should be taking a higher MG. Please advise

## 2018-11-20 NOTE — Telephone Encounter (Signed)
Spoke with patient and he states that he is taking the 10mg  Amlodipine and does not feel it is working.  Checked back with recent visits/BP checks and he is supposed to be taking this in addition to the Lisinopril-hydrochlorothiazide.   He states that he was not told that, he was under the impression this was a replacement.  Advised patient of notes from provider where the amlodipine was added.   He stated verbal understanding but states that the pharmacy did not give him the refill on lisinopril-hctz. Advised that it was sent in on 10/21/2018. He is going to call the pharmacy and he will call me back to let me know if this needs to be resent.

## 2019-08-04 ENCOUNTER — Telehealth: Payer: Self-pay

## 2019-08-04 DIAGNOSIS — I10 Essential (primary) hypertension: Secondary | ICD-10-CM

## 2019-08-04 MED ORDER — AMLODIPINE BESYLATE 10 MG PO TABS: 10.0000 mg | ORAL_TABLET | Freq: Every day | ORAL | 0 refills | Status: DC

## 2019-08-04 MED ORDER — LISINOPRIL-HYDROCHLOROTHIAZIDE 20-12.5 MG PO TABS: 1.0000 | ORAL_TABLET | Freq: Every day | ORAL | 0 refills | Status: DC

## 2019-08-04 NOTE — Telephone Encounter (Signed)
2 weeks worth of BP medications sent to the pharmacy for patient. Tried to contact patient 2x and line was busy.

## 2019-08-04 NOTE — Telephone Encounter (Signed)
Patient made an 08/06/19 appointment to refill his bp medication. Patient is out of medication and is having headaches.

## 2019-08-05 NOTE — Telephone Encounter (Signed)
Tried to contact patient again and line was still busy

## 2019-08-06 ENCOUNTER — Ambulatory Visit: Payer: Self-pay | Admitting: Family Medicine

## 2019-08-06 DIAGNOSIS — Z0289 Encounter for other administrative examinations: Secondary | ICD-10-CM

## 2019-09-10 ENCOUNTER — Other Ambulatory Visit: Payer: Self-pay

## 2019-09-10 DIAGNOSIS — I10 Essential (primary) hypertension: Secondary | ICD-10-CM

## 2019-09-10 MED ORDER — AMLODIPINE BESYLATE 10 MG PO TABS: 10.0000 mg | ORAL_TABLET | Freq: Every day | ORAL | 0 refills | Status: DC

## 2019-09-10 MED ORDER — LISINOPRIL-HYDROCHLOROTHIAZIDE 20-12.5 MG PO TABS: 1.0000 | ORAL_TABLET | Freq: Every day | ORAL | 0 refills | Status: DC

## 2019-09-10 NOTE — Telephone Encounter (Signed)
RF request for Norvasc and HCTZ  LOV: 10/21/2018 Next ov: 09/13/2019 Last written: 08/04/2019 #14.   Pt was given 14 day supply as he was over due for 6 month [facility]. Pt made appt last time and was a no show, enough meds were sent in to last until that appt. Please advise if okay to send in 3 days worth of medications

## 2019-09-10 NOTE — Telephone Encounter (Signed)
Patient states he has been out of blood pressure meds for 1 month, he scheduled appt with Dr. Claiborne Billings for Monday 3/1 He is requesting a couple of days of meds until his appt - he reports is has a really bad headache.  Please advise. Patient can be reached at [phone]. (this is his new cell number, number has been updated on profile)

## 2019-09-13 ENCOUNTER — Ambulatory Visit: Payer: Self-pay | Admitting: Family Medicine

## 2019-09-13 ENCOUNTER — Encounter: Payer: Self-pay | Admitting: Family Medicine

## 2019-09-13 ENCOUNTER — Other Ambulatory Visit: Payer: Self-pay

## 2019-09-13 VITALS — BP 158/82 | HR 98 | Temp 98.1°F | Resp 17 | Ht 68.0 in | Wt 207.5 lb

## 2019-09-13 DIAGNOSIS — I2510 Atherosclerotic heart disease of native coronary artery without angina pectoris: Secondary | ICD-10-CM

## 2019-09-13 DIAGNOSIS — E119 Type 2 diabetes mellitus without complications: Secondary | ICD-10-CM

## 2019-09-13 DIAGNOSIS — Z9119 Patient's noncompliance with other medical treatment and regimen: Secondary | ICD-10-CM

## 2019-09-13 DIAGNOSIS — Z91199 Patient's noncompliance with other medical treatment and regimen due to unspecified reason: Secondary | ICD-10-CM

## 2019-09-13 DIAGNOSIS — I10 Essential (primary) hypertension: Secondary | ICD-10-CM

## 2019-09-13 DIAGNOSIS — E785 Hyperlipidemia, unspecified: Secondary | ICD-10-CM

## 2019-09-13 LAB — POCT GLYCOSYLATED HEMOGLOBIN (HGB A1C)
HbA1c POC (<> result, manual entry): 8.1 % (ref 4.0–5.6)
HbA1c, POC (controlled diabetic range): 8.1 % — AB (ref 0.0–7.0)
HbA1c, POC (prediabetic range): 8.1 % — AB (ref 5.7–6.4)
Hemoglobin A1C: 8.1 % — AB (ref 4.0–5.6)

## 2019-09-13 MED ORDER — LISINOPRIL 30 MG PO TABS: 30.0000 mg | ORAL_TABLET | Freq: Every day | ORAL | 5 refills | Status: DC

## 2019-09-13 MED ORDER — GLIPIZIDE 5 MG PO TABS: 5.0000 mg | ORAL_TABLET | Freq: Two times a day (BID) | ORAL | 5 refills | Status: DC

## 2019-09-13 MED ORDER — SIMVASTATIN 40 MG PO TABS: 40.0000 mg | ORAL_TABLET | Freq: Every day | ORAL | 5 refills | Status: DC

## 2019-09-13 MED ORDER — METFORMIN HCL 1000 MG PO TABS: 1000.0000 mg | ORAL_TABLET | Freq: Two times a day (BID) | ORAL | 5 refills | Status: DC

## 2019-09-13 MED ORDER — AMLODIPINE BESYLATE 10 MG PO TABS: 10.0000 mg | ORAL_TABLET | Freq: Every day | ORAL | 5 refills | Status: DC

## 2019-09-13 MED ORDER — METFORMIN HCL ER 750 MG PO TB24: 1500.0000 mg | ORAL_TABLET | Freq: Every day | ORAL | 5 refills | Status: DC

## 2019-09-13 NOTE — Progress Notes (Signed)
This visit occurred during the SARS-CoV-2 public health emergency.  Safety protocols were in place, including screening questions prior to the visit, additional usage of staff PPE, and extensive cleaning of exam room while observing appropriate contact time as indicated for disinfecting solutions.    Stephen Lucero , March 14, 1966, 54 y.o., male MRN: [id number] Patient Care Team    Relationship Specialty Notifications Start End  Natalia Leatherwood, DO PCP - General Family Medicine  08/22/15     Chief Complaint  Patient presents with  . Hypertension    Pt needs refills on medications. No longer has insurance. Has not had eye exam.   . Diabetes     Subjective: Stephen Lucero is a 54 y.o. male present for [facility]. Essential hypertension, benign/Hyperlipidemia: Patietn reports compliance with lisinopril 20- HCTZ 12.5 and amlodipine 10 mg daily.  He had been out of medicine for a few months a short prescription was called in for him and he restarted last week. Blood pressures ranges at home not checked. Patient denies chest pain, shortness of breath, dizziness or lower extremity edema.  Pt takes a  daily baby ASA. Pt is  prescribed statin. He has a fhx of heart disease. He has had studies completed in 2012. He was referred to cardiology in 08/16/2016 for same symptoms and cancelled appt prior to being seen.  He has not had insurance over the last few years-creating a barrier for him to afford medications and appropriate laboratory evaluation/follow-up appointments. BMP: 09/2017 --> normal.  CBC: 09/2017 --> WNL Lipid:09/2017  total 233, HDL 51, LDL 165, Tg 86 TSH: 09/2017 WNL Diet: low sodium, but does not monitor Exercise: Not routinely.  RF: HTN, HLD, OBESITY, DM  Diabetes: Pt reports taking his metformin QD (prescribed BID). This has been a consistent report with him taking only once a day, when it is prescribed twice daily. Patient denies dizziness, hyperglycemic or hypoglycemic events. Patient denies  numbness, tingling in the extremities or nonhealing wounds of feet.  Pt reports BG ranges are not routinely checked> he has declined in the past. PNA series: completed 10/03/2017 Flu shot: declined (recommneded yearly) Foot exam: UTD 09/14/2019 Eye exam: *overdue_-> financial limitations. A1c: 5.7>5.9>6.2>8.1 today  EKG 04/2011 Ventricular Rate 95BPM  Atrial Rate95BPM  P-R Interval 162 ms QRS Duration 74ms Q-T Interval 350 ms QTC439 ms P Axis 58degrees  R Axis -21 degrees  T Axis 51degrees  Sinus rhythm Normal ECG Echocardiogram 02/2011 Normal left ventricular size Mild left ventricular concentric hypertrophy Preserved left ventricular systolic function No segmental wall motion abnormalities Normal right ventricular size Normal right ventricular function Dilated left atrium No significant valve stenosis No significant valvular regurgitation No pericardial effusion No old study for comparison Small 1 cm diameter mass protruding from the interatrial septum  into the right atrium.Uncertain etiology.This may represent  unusual lipomatous hypertrophy, but cannot exclude myxoma. Suggest cardiac MRI for further clarification. I have personally reviewed and interpreted this echocardiogram.  [facility] MR left heart INTERPRETATION 1. No evidence of a right atrial mass. Significant lipomatous hypertrophy of the interatrial septum, which accounts for the abnormality seen on echocardiography.2. Normal LV size and systolic  function, EF is 69%.3. No evidence of myocardial hyperenhancement suggestive of fibrosis or infarction. CARDIAC CATHETERIZATION PROCEDURE(S): 02/2011 Left heart cath, Coronary Angiography and Left Ventriculogram (56812)  STUDY INDICATION(S): Unstable Angina (411.1) APPROACH: Approach 1: Percutaneous right radial artery Sheath: 12F CATHETERS: 12F Tiger Radial 4.0, 12F Pig Infiniti, 12F JR4 Impulse  Diagnostic  Catheter HEMODYNAMIC DATA:  Height: 67 in.  Weight: 207 lbs. LEFT HEART CATHETERIZATION PROTOCOL PRESSURES:  SiteBaseline Values, mm HgS/P Angiography, mm Hg  S/D Mean S/DMean  AO 139 / 1152 OTHER HEMODYNAMIC INFORMATION:  Heart Rate:65  CARDIOVASCULAR ANGIOGRAPHY LEFT VENTRICULOGRAPHY:  Ejection Fraction: 60%  LV Chamber Size: Normal  Regional Wall Motion:Normal  CORONARY ANGIOGRAPHY: Vessel SegmentVessel TypeStenosis (%)DescriptionComment Proximal LADNative 50 3rd DiagonalNative 50 Ramus Present: No Coronary Dominance:Right CONCLUSIONS:  CORONARY STATUS: Moderate non-obstructive ASCAD  LV FUNCTION:  Ejection Fraction: 60%  Wall Motion: Normal   OTHER:LM nl LAD prox 50%, Diag 50% LCX nl RCA nl     Depression screen [facility] 2/9 09/13/2019 10/03/2017 04/24/2016  Decreased Interest 0 0 0  Down, Depressed, Hopeless 0 0 0  PHQ - 2 Score 0 0 0    Allergies  Allergen Reactions  . Lipitor [Atorvastatin] Other (See Comments)    mylagia  . Shellfish-Derived Products Other (See Comments)    oysters   Social History   Social History Narrative   Single. No children.   11th grade education.    Wears seatbelt    Regular diet.    Smoke detector in the home.    Feels safe in relationship.    No firearms in the home.    Past Medical History:  Diagnosis Date  . Allergy   . Diabetes mellitus without complication (HCC)   . GERD (gastroesophageal reflux disease)   . High cholesterol   . Hypercalcemia 10/06/2017  . Hypertension    Past Surgical History:  Procedure Laterality Date  . CARDIAC CATHETERIZATION     Family History  Problem Relation Age of Onset  . Alcohol abuse Father   . Cirrhosis Father   . Early death Father 59       cirrhosis   . Cancer Maternal Aunt   . Heart disease Paternal Grandfather   . Hyperlipidemia Paternal Grandfather   . Hypertension Paternal Grandfather    Allergies as of 09/13/2019      Reactions   Lipitor [atorvastatin] Other (See Comments)   mylagia   Shellfish-derived Products Other (See Comments)   oysters      Medication List       Accurate as of September 13, 2019 11:59 PM. If you have any questions, ask your nurse or doctor.        STOP taking these medications   lisinopril-hydrochlorothiazide 20-12.5 MG tablet Commonly known as: ZESTORETIC Stopped by: Felix Pacini, DO   metFORMIN 500 MG 24 hr tablet Commonly known as: GLUCOPHAGE-XR Replaced by: metFORMIN 1000 MG tablet Stopped by: Felix Pacini, DO     TAKE these medications   amLODipine 10 MG tablet Commonly known as: NORVASC Take 1 tablet (10 mg total) by mouth daily.   aspirin EC 81 MG tablet Take 81 mg by mouth daily.   glipiZIDE 5 MG tablet Commonly known as: GLUCOTROL Take 1 tablet (5 mg total) by mouth 2 (two) times daily before a meal. Started by: Felix Pacini, DO   lisinopril 30 MG tablet Commonly known as: ZESTRIL Take 1 tablet (30 mg total) by mouth daily. Started by: Felix Pacini, DO   loratadine 10 MG tablet Commonly known as: CLARITIN Take 10 mg by mouth daily.   metFORMIN 1000 MG tablet Commonly known as: GLUCOPHAGE Take 1 tablet (1,000 mg total) by mouth 2  (two) times daily with a meal. Replaces: metFORMIN 500 MG 24 hr tablet Started by: Felix Pacini, DO   nitroGLYCERIN 0.4  MG SL tablet Commonly known as: NITROSTAT Place 1 tablet (0.4 mg total) under the tongue every 5 (five) minutes as needed for chest pain. Reported on 11/27/2015   simvastatin 40 MG tablet Commonly known as: ZOCOR Take 1 tablet (40 mg total) by mouth at bedtime.       All past medical history, surgical history, allergies, family history, immunizations andmedications were updated in the EMR today and reviewed under the history and medication portions of their EMR.     ROS: Negative, with the exception of above mentioned in HPI   Objective:  BP (!) 158/82 (BP Location: Left Arm, Patient Position: Sitting, Cuff Size: Normal)   Pulse 98   Temp 98.1 F (36.7 C) (Temporal)   Resp 17   Ht 5\' 8"  (1.727 m)   Wt 207 lb 8 oz (94.1 kg)   SpO2 98%   BMI 31.55 kg/m  Body mass index is 31.55 kg/m. Gen: Afebrile. No acute distress. Nontoxic in appearance, well developed, well nourished.  Chronically flushed appearing HENT: AT. .  Eyes:Pupils Equal Round Reactive to light, Extraocular movements intact,  Conjunctiva without redness, discharge or icterus. Neck/lymp/endocrine: Supple, no lymphadenopathy CV: RRR no murmur, no edema Chest: CTAB, no wheeze or crackles. Good air movement, normal resp effort.  Neuro:  Normal gait. PERLA. EOMi. Alert. Oriented x3  Psych: Normal affect, dress and demeanor. Normal speech. Normal thought content and judgment.  No exam data present No results found. No results found for this or any previous visit (from the past 24 hour(s)).  Assessment/Plan: Stephen Lucero is a 53 y.o. male present for OV for  Essential hypertension, benign/HLD/ CAD/Noncompliance Elevated again today.  Patient reports he started his medicines again 3 days ago.  There does not seem to be much of a change even though amlodipine 10 mg daily was added since his last  evaluation.  Noncompliance is an issue and makes it difficult to adjust medications. -Continue amlodipine 10 mg daily -DC lisinopril/HCTZ combo and increased lisinopril 30 mg daily.  - Basic Metabolic Panel (BMET) -Continue statin -Continue baby aspirin  diabetes type 2/Noncompliance with diabetes treatment Patient is noncompliant with medications and follow-up appointments secondary to lack of insurance.  Discussed with him today the importance of maintaining adequate health care and provided him with contact information to establish with [facility], and hopes they will be able to better accommodate him with his financial limitations.  He reports he is agreeable to this option today. -Start Metformin at increased dose of 1000 mg twice daily -Start glipizide 5 mg twice daily -Diabetic diet strongly encouraged. -Patient was provided with a glucose monitor and supplies.  He was instructed on proper glucose monitoring technique.  He was educated on normal glucose readings. - Basic Metabolic Panel (BMET) Pt reports BG ranges are not routinely checked> he has declined in the past.  He was encouraged to start given we are starting additional medications including sulfonylurea today. PNA series: completed 10/03/2017 Flu shot: declined (recommneded yearly) Foot exam: UTD 09/14/2019 Eye exam: *overdue_-> financial limitations. A1c: 5.7>5.9>6.2>8.1 today Follow-up in 3 months either with new PCP, or here if unable to become established.    Reviewed expectations re: course of current medical issues.  Discussed self-management of symptoms.  Outlined signs and symptoms indicating need for more acute intervention.  Patient verbalized understanding and all questions were answered.  Patient received an After-Visit Summary.    Orders Placed This Encounter  Procedures  . Basic Metabolic Panel (  BMET)  . POCT glycosylated hemoglobin (Hb A1C)   Meds ordered this  encounter  Medications  . amLODipine (NORVASC) 10 MG tablet    Sig: Take 1 tablet (10 mg total) by mouth daily.    Dispense:  30 tablet    Refill:  5  . lisinopril (ZESTRIL) 30 MG tablet    Sig: Take 1 tablet (30 mg total) by mouth daily.    Dispense:  30 tablet    Refill:  5  . simvastatin (ZOCOR) 40 MG tablet    Sig: Take 1 tablet (40 mg total) by mouth at bedtime.    Dispense:  30 tablet    Refill:  5  . DISCONTD: metFORMIN (GLUCOPHAGE-XR) 750 MG 24 hr tablet    Sig: Take 2 tablets (1,500 mg total) by mouth daily.    Dispense:  60 tablet    Refill:  5  . glipiZIDE (GLUCOTROL) 5 MG tablet    Sig: Take 1 tablet (5 mg total) by mouth 2 (two) times daily before a meal.    Dispense:  60 tablet    Refill:  5  . metFORMIN (GLUCOPHAGE) 1000 MG tablet    Sig: Take 1 tablet (1,000 mg total) by mouth 2 (two) times daily with a meal.    Dispense:  60 tablet    Refill:  5    Please DC other metformin script. thanks   Referral Orders  No referral(s) requested today     Note is dictated utilizing voice recognition software. Although note has been proof read prior to signing, occasional typographical errors still can be missed. If any questions arise, please do not hesitate to call for verification.   electronically signed by:  Howard Pouch, DO  [place]

## 2019-09-13 NOTE — Patient Instructions (Addendum)
We will get you contact information to the [facility] health residency programs to establish for care. They can provide a more cost affordable option to you.       [facility] [phone] [facility] [phone].   Your a1c increased to 8.1.  - Increase metformin to twice a day (dose is also increased).  - Started glipizide twice a day>> must take just before a meal. Do not take if not eating just after taking.    Check sugars if you feel dizzy or fatigued.  Check routinely once a day on the morning and in the afternoon>> goal sugar is 70-110.    Increased your BP regimen also Amlodipine stays at 10 mg (max dose) Lisinopril increased to 30 mg a day.   Follow up in 3 months>> can be at the new place or here if you are not established yet.

## 2019-09-14 ENCOUNTER — Encounter: Payer: Self-pay | Admitting: Family Medicine

## 2019-09-14 DIAGNOSIS — E119 Type 2 diabetes mellitus without complications: Secondary | ICD-10-CM | POA: Insufficient documentation

## 2019-09-14 LAB — BASIC METABOLIC PANEL
BUN: 15 mg/dL (ref 6–23)
CO2: 28 mEq/L (ref 19–32)
Calcium: 9.7 mg/dL (ref 8.4–10.5)
Chloride: 94 mEq/L — ABNORMAL LOW (ref 96–112)
Creatinine, Ser: 1.07 mg/dL (ref 0.40–1.50)
GFR: 71.49 mL/min (ref >60.00)
Glucose, Bld: 396 mg/dL — ABNORMAL HIGH (ref 70–99)
Potassium: 4.1 mEq/L (ref 3.5–5.1)
Sodium: 132 mEq/L — ABNORMAL LOW (ref 135–145)

## 2020-03-20 ENCOUNTER — Other Ambulatory Visit: Payer: Self-pay | Admitting: Family Medicine

## 2020-03-21 ENCOUNTER — Telehealth: Payer: Self-pay

## 2020-03-21 NOTE — Telephone Encounter (Signed)
Pt needs to schedule OV.  

## 2020-04-14 ENCOUNTER — Other Ambulatory Visit: Payer: Self-pay | Admitting: Family Medicine

## 2020-04-23 ENCOUNTER — Other Ambulatory Visit: Payer: Self-pay | Admitting: Family Medicine

## 2020-04-24 MED ORDER — METFORMIN HCL 1000 MG PO TABS: 1000.0000 mg | ORAL_TABLET | Freq: Two times a day (BID) | ORAL | 0 refills | Status: DC

## 2020-04-24 NOTE — Addendum Note (Signed)
Addended by: Maxie Barb on: 04/24/2020 10:40 AM   Modules accepted: Orders

## 2020-04-24 NOTE — Telephone Encounter (Signed)
Stephen Lucero period given

## 2020-04-24 NOTE — Telephone Encounter (Signed)
Patient does not have enough Lisinopril & Metformin to make it until 04/27/20 office visit. [facility] [place] gave him 2 pills 04/23/20 which is enough to make it through today

## 2020-04-27 ENCOUNTER — Ambulatory Visit: Payer: Self-pay | Admitting: Family Medicine

## 2020-05-03 ENCOUNTER — Encounter: Payer: Self-pay | Admitting: Family Medicine

## 2020-05-29 ENCOUNTER — Other Ambulatory Visit: Payer: Self-pay

## 2020-05-29 ENCOUNTER — Encounter: Payer: Self-pay | Admitting: Family Medicine

## 2020-05-29 ENCOUNTER — Ambulatory Visit (INDEPENDENT_AMBULATORY_CARE_PROVIDER_SITE_OTHER): Payer: Self-pay | Admitting: Family Medicine

## 2020-05-29 VITALS — BP 197/102 | HR 100 | Temp 98.7°F | Ht 67.0 in | Wt 204.0 lb

## 2020-05-29 DIAGNOSIS — E785 Hyperlipidemia, unspecified: Secondary | ICD-10-CM

## 2020-05-29 DIAGNOSIS — E119 Type 2 diabetes mellitus without complications: Secondary | ICD-10-CM

## 2020-05-29 LAB — POCT GLYCOSYLATED HEMOGLOBIN (HGB A1C)
HbA1c POC (<> result, manual entry): 8.2 % (ref 4.0–5.6)
HbA1c, POC (controlled diabetic range): 8.2 % — AB (ref 0.0–7.0)
HbA1c, POC (prediabetic range): 8.2 % — AB (ref 5.7–6.4)
Hemoglobin A1C: 8.2 % — AB (ref 4.0–5.6)

## 2020-05-29 MED ORDER — GLIPIZIDE 5 MG PO TABS
5.0000 mg | ORAL_TABLET | Freq: Two times a day (BID) | ORAL | 5 refills | Status: DC
Start: 2020-05-29 — End: 2023-08-25

## 2020-05-29 MED ORDER — METFORMIN HCL 1000 MG PO TABS
1000.0000 mg | ORAL_TABLET | Freq: Two times a day (BID) | ORAL | 5 refills | Status: DC
Start: 2020-05-29 — End: 2023-08-25

## 2020-05-29 MED ORDER — SIMVASTATIN 40 MG PO TABS: 40.0000 mg | ORAL_TABLET | Freq: Every day | ORAL | 5 refills | Status: DC

## 2020-05-29 MED ORDER — LISINOPRIL 40 MG PO TABS
40.0000 mg | ORAL_TABLET | Freq: Every day | ORAL | 5 refills | Status: DC
Start: 2020-05-29 — End: 2021-01-31

## 2020-05-29 MED ORDER — AMLODIPINE BESYLATE 10 MG PO TABS
10.0000 mg | ORAL_TABLET | Freq: Every day | ORAL | 5 refills | Status: DC
Start: 2020-05-29 — End: 2021-01-11

## 2020-05-29 NOTE — Patient Instructions (Addendum)
Refilled meds today  Follow up in 4 months.    Diabetes Mellitus and Nutrition, Adult When you have diabetes (diabetes mellitus), it is very important to have healthy eating habits because your blood sugar (glucose) levels are greatly affected by what you eat and drink. Eating healthy foods in the appropriate amounts, at about the same times every day, can help you:  Control your blood glucose.  Lower your risk of heart disease.  Improve your blood pressure.  Reach or maintain a healthy weight. Every person with diabetes is different, and each person has different needs for a meal plan. Your health care provider may recommend that you work with a diet and nutrition specialist (dietitian) to make a meal plan that is best for you. Your meal plan may vary depending on factors such as:  The calories you need.  The medicines you take.  Your weight.  Your blood glucose, blood pressure, and cholesterol levels.  Your activity level.  Other health conditions you have, such as heart or kidney disease. How do carbohydrates affect me? Carbohydrates, also called carbs, affect your blood glucose level more than any other type of food. Eating carbs naturally raises the amount of glucose in your blood. Carb counting is a method for keeping track of how many carbs you eat. Counting carbs is important to keep your blood glucose at a healthy level, especially if you use insulin or take certain oral diabetes medicines. It is important to know how many carbs you can safely have in each meal. This is different for every person. Your dietitian can help you calculate how many carbs you should have at each meal and for each snack. Foods that contain carbs include:  Bread, cereal, rice, pasta, and crackers.  Potatoes and corn.  Peas, beans, and lentils.  Milk and yogurt.  Fruit and juice.  Desserts, such as cakes, cookies, ice cream, and candy. How does alcohol affect me? Alcohol can cause a sudden  decrease in blood glucose (hypoglycemia), especially if you use insulin or take certain oral diabetes medicines. Hypoglycemia can be a life-threatening condition. Symptoms of hypoglycemia (sleepiness, dizziness, and confusion) are similar to symptoms of having too much alcohol. If your health care provider says that alcohol is safe for you, follow these guidelines:  Limit alcohol intake to no more than 1 drink per day for nonpregnant women and 2 drinks per day for men. One drink equals 12 oz of beer, 5 oz of wine, or 1 oz of hard liquor.  Do not drink on an empty stomach.  Keep yourself hydrated with water, diet soda, or unsweetened iced tea.  Keep in mind that regular soda, juice, and other mixers may contain a lot of sugar and must be counted as carbs. What are tips for following this plan?  Reading food labels  Start by checking the serving size on the "Nutrition Facts" label of packaged foods and drinks. The amount of calories, carbs, fats, and other nutrients listed on the label is based on one serving of the item. Many items contain more than one serving per package.  Check the total grams (g) of carbs in one serving. You can calculate the number of servings of carbs in one serving by dividing the total carbs by 15. For example, if a food has 30 g of total carbs, it would be equal to 2 servings of carbs.  Check the number of grams (g) of saturated and trans fats in one serving. Choose foods that have  low or no amount of these fats.  Check the number of milligrams (mg) of salt (sodium) in one serving. Most people should limit total sodium intake to less than 2,300 mg per day.  Always check the nutrition information of foods labeled as "low-fat" or "nonfat". These foods may be higher in added sugar or refined carbs and should be avoided.  Talk to your dietitian to identify your daily goals for nutrients listed on the label. Shopping  Avoid buying canned, premade, or processed foods.  These foods tend to be high in fat, sodium, and added sugar.  Shop around the outside edge of the grocery store. This includes fresh fruits and vegetables, bulk grains, fresh meats, and fresh dairy. Cooking  Use low-heat cooking methods, such as baking, instead of high-heat cooking methods like deep frying.  Cook using healthy oils, such as olive, canola, or sunflower oil.  Avoid cooking with butter, cream, or high-fat meats. Meal planning  Eat meals and snacks regularly, preferably at the same times every day. Avoid going long periods of time without eating.  Eat foods high in fiber, such as fresh fruits, vegetables, beans, and whole grains. Talk to your dietitian about how many servings of carbs you can eat at each meal.  Eat 4-6 ounces (oz) of lean protein each day, such as lean meat, chicken, fish, eggs, or tofu. One oz of lean protein is equal to: ? 1 oz of meat, chicken, or fish. ? 1 egg. ?  cup of tofu.  Eat some foods each day that contain healthy fats, such as avocado, nuts, seeds, and fish. Lifestyle  Check your blood glucose regularly.  Exercise regularly as told by your health care provider. This may include: ? 150 minutes of moderate-intensity or vigorous-intensity exercise each week. This could be brisk walking, biking, or water aerobics. ? Stretching and doing strength exercises, such as yoga or weightlifting, at least 2 times a week.  Take medicines as told by your health care provider.  Do not use any products that contain nicotine or tobacco, such as cigarettes and e-cigarettes. If you need help quitting, ask your health care provider.  Work with a Veterinary surgeon or diabetes educator to identify strategies to manage stress and any emotional and social challenges. Questions to ask a health care provider  Do I need to meet with a diabetes educator?  Do I need to meet with a dietitian?  What number can I call if I have questions?  When are the best times to check  my blood glucose? Where to find more information:  American Diabetes Association: diabetes.org  Academy of Nutrition and Dietetics: www.eatright.[employer] of Diabetes and Digestive and Kidney Diseases (NIH): CarFlippers.tn Summary  A healthy meal plan will help you control your blood glucose and maintain a healthy lifestyle.  Working with a diet and nutrition specialist (dietitian) can help you make a meal plan that is best for you.  Keep in mind that carbohydrates (carbs) and alcohol have immediate effects on your blood glucose levels. It is important to count carbs and to use alcohol carefully. This information is not intended to replace advice given to you by your health care provider. Make sure you discuss any questions you have with your health care provider. Document Revised: 06/13/2017 Document Reviewed: 08/05/2016 Elsevier Patient Education  2020 [employer].

## 2020-05-29 NOTE — Progress Notes (Signed)
This visit occurred during the SARS-CoV-2 public health emergency.  Safety protocols were in place, including screening questions prior to the visit, additional usage of staff PPE, and extensive cleaning of exam room while observing appropriate contact time as indicated for disinfecting solutions.    Stephen Lucero , [birth year], 54 y.o., male MRN: [id number] Patient Care Team    Relationship Specialty Notifications Start End  Stephen Leatherwood, DO PCP - General Family Medicine  08/22/15     Chief Complaint  Patient presents with   Follow-up    [facility]; pt is not fasting     Subjective: Stephen Lucero is a 54 y.o. male present for [facility]. Essential hypertension, benign/Hyperlipidemia: Patietn reports compliance with lisinopril 20- HCTZ 12.5 and amlodipine 10 mg daily.  He had been out of medicine for a few months a short prescription was called in for him and he restarted last week. Blood pressures ranges at home not checked. Patient denies chest pain, shortness of breath, dizziness or lower extremity edema.  Pt takes a  daily baby ASA. Pt is  prescribed statin. He has a fhx of heart disease. He has had studies completed in 2012. He was referred to cardiology in 08/16/2016 for same symptoms and cancelled appt prior to being seen.  He has not had insurance over the last few years-creating a barrier for him to afford medications and appropriate laboratory evaluation/follow-up appointments. Diet: low sodium, but does not monitor Exercise: Not routinely.  RF: HTN, HLD, OBESITY, DM  Diabetes: Pt reports he is taking his metformin QD (prescribed BID). He has not been taking his glipizide bc he felt better This has been a consistent report with him taking only once a day, when it is prescribed twice daily. Patient denies dizziness, hyperglycemic or hypoglycemic events. Patient denies dizziness, hyperglycemic or hypoglycemic events. Patient denies numbness, tingling in the extremities or nonhealing wounds of  feet.  Pt reports BG ranges are not routinely checked> he has declined in the past. PNA series: completed 10/03/2017 Flu shot: UTD 2021 (recommneded yearly) Foot exam: UTD 09/14/2019 Eye exam: overdue-> financial limitations. A1c: 5.7>5.9>6.2>8.1>8.2 today  Depression screen [facility] 2/9 05/29/2020 09/13/2019 10/03/2017 04/24/2016  Decreased Interest 0 0 0 0  Down, Depressed, Hopeless 0 0 0 0  PHQ - 2 Score 0 0 0 0    Allergies  Allergen Reactions   Lipitor [Atorvastatin] Other (See Comments)    mylagia   Shellfish-Derived Products Other (See Comments)    oysters   Social History   Social History Narrative   Single. No children.   11th grade education.    Wears seatbelt   Regular diet.    Smoke detector in the home.    Feels safe in relationship.    No firearms in the home.    Past Medical History:  Diagnosis Date   Allergy    Diabetes mellitus without complication (HCC)    GERD (gastroesophageal reflux disease)    High cholesterol    Hypercalcemia 10/06/2017   Hypertension    Past Surgical History:  Procedure Laterality Date   CARDIAC CATHETERIZATION     Family History  Problem Relation Age of Onset   Alcohol abuse Father    Cirrhosis Father    Early death Father 67       cirrhosis    Cancer Maternal Aunt    Heart disease Paternal Grandfather    Hyperlipidemia Paternal Grandfather    Hypertension Paternal Grandfather    Allergies as of 05/29/2020  Reactions   Lipitor [atorvastatin] Other (See Comments)   mylagia   Shellfish-derived Products Other (See Comments)   oysters      Medication List       Accurate as of May 29, 2020 10:40 AM. If you have any questions, ask your nurse or doctor.        amLODipine 10 MG tablet Commonly known as: NORVASC Take 1 tablet (10 mg total) by mouth daily.   aspirin EC 81 MG tablet Take 81 mg by mouth daily.   EPINEPHrine 0.3 mg/0.3 mL Soaj injection Commonly known as: EPI-PEN Inject into the  muscle.   famotidine 20 MG tablet Commonly known as: PEPCID Take by mouth.   glipiZIDE 5 MG tablet Commonly known as: GLUCOTROL Take 1 tablet (5 mg total) by mouth 2 (two) times daily before a meal.   lisinopril 30 MG tablet Commonly known as: ZESTRIL TAKE 1 TABLET BY MOUTH EVERY DAY   loratadine 10 MG tablet Commonly known as: CLARITIN Take 10 mg by mouth daily.   metFORMIN 1000 MG tablet Commonly known as: GLUCOPHAGE Take 1 tablet (1,000 mg total) by mouth 2 (two) times daily with a meal.   nitroGLYCERIN 0.4 MG SL tablet Commonly known as: NITROSTAT Place 1 tablet (0.4 mg total) under the tongue every 5 (five) minutes as needed for chest pain. Reported on 11/27/2015   simvastatin 40 MG tablet Commonly known as: ZOCOR Take 1 tablet (40 mg total) by mouth at bedtime.       All past medical history, surgical history, allergies, family history, immunizations andmedications were updated in the EMR today and reviewed under the history and medication portions of their EMR.     ROS: Negative, with the exception of above mentioned in HPI   Objective:  BP (!) 197/102    Pulse 100    Temp 98.7 F (37.1 C) (Oral)    Ht 5\' 7"  (1.702 m)    Wt 204 lb (92.5 kg)    SpO2 97%    BMI 31.95 kg/m  Body mass index is 31.95 kg/m. Gen: Afebrile. No acute distress.  HENT: AT. [place].  Eyes:Pupils Equal Round Reactive to light, Extraocular movements intact,  Conjunctiva without redness, discharge or icterus. Neck/lymp/endocrine: Supple,no lymphadenopathy, no thyromegaly CV: RRR no murmur, no edema, +2/4 P posterior tibialis pulses Chest: CTAB, no wheeze or crackles Skin: no rashes, purpura or petechiae.  Neuro:  Normal gait. PERLA. EOMi. Alert. Oriented. Cranial nerves II through XII intact.  Psych: Normal affect, dress and demeanor. Normal speech. Normal thought content and judgment..    No exam data present No results found. Results for orders placed or performed in visit on 05/29/20  (from the past 24 hour(s))  POCT HgB A1C     Status: Abnormal   Collection Time: 05/29/20 10:35 AM  Result Value Ref Range   Hemoglobin A1C 8.2 (A) 4.0 - 5.6 %   HbA1c POC (<> result, manual entry) 8.2 4.0 - 5.6 %   HbA1c, POC (prediabetic range) 8.2 (A) 5.7 - 6.4 %   HbA1c, POC (controlled diabetic range) 8.2 (A) 0.0 - 7.0 %    Assessment/Plan: Brantlee Penn is a 54 y.o. male present for OV for  Essential hypertension, benign/HLD/ CAD/Noncompliance Has been out of medications again d/t lack of compliance and follow up.   - Continue amlodipine 10 mg daily  -  increased lisinopril 40 mg daily.  -  continue statin - continue baby aspirin  diabetes type 2/Noncompliance with diabetes  treatment Patient is noncompliant with medications and follow-up appointments secondary to lack of insurance. He has been provided with [facility] as a more affordable option for him on more than one occasion.  -continue  Metformin at increased dose of 1000 mg twice daily -RESTART glipizide 5 mg twice daily -Diabetic diet strongly encouraged. -Patient was provided with a glucose monitor and supplies.  He was instructed on proper glucose monitoring technique.  He was educated on normal glucose readings last appt.  PNA series: completed 10/03/2017 Flu shot: UTD 2021 (recommneded yearly) Foot exam: UTD 09/14/2019 Eye exam: overdue_-> financial limitations. A1c: 5.7>5.9>6.2>8.1>8.2 today Follow-up in 4 mos   Reviewed expectations re: course of current medical issues.  Discussed self-management of symptoms.  Outlined signs and symptoms indicating need for more acute intervention.  Patient verbalized understanding and all questions were answered.  Patient received an After-Visit Summary.    Orders Placed This Encounter  Procedures   POCT HgB A1C   No orders of the defined types were placed in this encounter.  Referral Orders  No referral(s) requested today      Note is dictated utilizing voice recognition software. Although note has been proof read prior to signing, occasional typographical errors still can be missed. If any questions arise, please do not hesitate to call for verification.   electronically signed by:  Felix Pacini, DO  [facility] [place]

## 2020-09-25 ENCOUNTER — Ambulatory Visit: Payer: Self-pay | Admitting: Family Medicine

## 2020-09-25 DIAGNOSIS — Z0289 Encounter for other administrative examinations: Secondary | ICD-10-CM

## 2020-10-17 ENCOUNTER — Encounter: Payer: Self-pay | Admitting: Family Medicine

## 2020-12-10 ENCOUNTER — Other Ambulatory Visit: Payer: Self-pay | Admitting: Family Medicine

## 2021-01-11 ENCOUNTER — Other Ambulatory Visit: Payer: Self-pay | Admitting: Family Medicine

## 2021-01-30 ENCOUNTER — Other Ambulatory Visit: Payer: Self-pay | Admitting: Family Medicine

## 2021-02-27 ENCOUNTER — Other Ambulatory Visit: Payer: Self-pay

## 2021-03-08 ENCOUNTER — Other Ambulatory Visit: Payer: Self-pay | Admitting: Family Medicine

## 2021-03-30 ENCOUNTER — Telehealth: Payer: Self-pay

## 2021-03-30 MED ORDER — LISINOPRIL 40 MG PO TABS: 40.0000 mg | ORAL_TABLET | Freq: Every day | ORAL | 0 refills | Status: DC

## 2021-03-30 NOTE — Telephone Encounter (Signed)
  Encourage patient to contact the pharmacy for refills or they can request refills through [facility]  LAST APPOINTMENT DATE:  05/29/2020  NEXT APPOINTMENT DATE: 04/02/21  MEDICATION:lisinopril (ZESTRIL) 40 MG tablet  Is the patient out of medication? yes  [facility]ACY: [id number] [place],  - [address]  Let patient know to contact pharmacy at the end of the day to make sure medication is ready.  Please notify patient to allow 48-72 hours to process

## 2021-03-30 NOTE — Telephone Encounter (Signed)
Spoke with pt regarding meds and informed him rx has been sent.

## 2021-04-02 ENCOUNTER — Ambulatory Visit: Payer: Self-pay | Admitting: Family Medicine

## 2021-04-02 DIAGNOSIS — E785 Hyperlipidemia, unspecified: Secondary | ICD-10-CM

## 2021-04-02 MED ORDER — LISINOPRIL 40 MG PO TABS: 40.0000 mg | ORAL_TABLET | Freq: Every day | ORAL | 0 refills | Status: DC

## 2021-04-02 MED ORDER — AMLODIPINE BESYLATE 10 MG PO TABS
10.0000 mg | ORAL_TABLET | Freq: Every day | ORAL | 0 refills | Status: DC
Start: 2021-04-02 — End: 2021-05-14

## 2021-04-02 NOTE — Addendum Note (Signed)
Addended by: Maxie Barb on: 04/02/2021 08:20 AM   Modules accepted: Orders

## 2021-04-19 ENCOUNTER — Ambulatory Visit: Payer: Self-pay | Admitting: Family Medicine

## 2021-04-19 ENCOUNTER — Encounter: Payer: Self-pay | Admitting: Family Medicine

## 2021-04-19 DIAGNOSIS — E785 Hyperlipidemia, unspecified: Secondary | ICD-10-CM

## 2021-04-19 DIAGNOSIS — I2510 Atherosclerotic heart disease of native coronary artery without angina pectoris: Secondary | ICD-10-CM

## 2021-05-08 ENCOUNTER — Telehealth: Payer: Self-pay

## 2021-05-08 NOTE — Telephone Encounter (Signed)
Pt called to schedule appt. Pt missed last appt 04/19/21 causing him to be dismissed from practice. I attempted to schedule pt on 05/22/21 as requested by patient and it does not allow me to schedule him. Please contact patient to schedule appropriately.   Pt states he can make an appt after 05/18/21.   Number is correct [phone]  Pt can not verify address at this time. We have returned mail for pt.

## 2021-05-10 NOTE — Telephone Encounter (Signed)
11/8 appt scheduled. Annabelle Harman to f/u with pt for corrected address. I assume pt is unaware of dismissal since mail was returned (per below).  Copy of dismissal will need to be provided to pt at end of visit 11/8. KO

## 2021-05-10 NOTE — Telephone Encounter (Signed)
Noted  

## 2021-05-11 NOTE — Telephone Encounter (Signed)
Caller Name: Trey Paula Call back phone #: [phone]  MEDICATION(S):  amLODipine (NORVASC) 10 MG tablet  lisinopril (ZESTRIL) 40 MG tablet  Pt has been out x 1 wk   Has the patient contacted their pharmacy? Yes.    IF YES, when and what did the pharmacy advise? Pt states [facility] - pt has moved to [place] and would prefer [facility] now  [facility]erred [facility]  [phone] [place] [place] - [address] Phone:  [phone]  Fax:  [phone]

## 2021-05-14 MED ORDER — AMLODIPINE BESYLATE 10 MG PO TABS: 10.0000 mg | ORAL_TABLET | Freq: Every day | ORAL | 0 refills | Status: DC

## 2021-05-14 NOTE — Telephone Encounter (Signed)
Pt informed rx sent.

## 2021-05-14 NOTE — Telephone Encounter (Signed)
Unable to lvm, due to vm being full

## 2021-05-14 NOTE — Telephone Encounter (Signed)
Okay to resend. 

## 2021-05-14 NOTE — Telephone Encounter (Signed)
Rx sent 

## 2021-05-14 NOTE — Addendum Note (Signed)
Addended by: Maxie Barb on: 05/14/2021 09:30 AM   Modules accepted: Orders

## 2021-05-14 NOTE — Telephone Encounter (Signed)
Pt called to follow up on this, please advise. He would like a call back today

## 2021-05-14 NOTE — Telephone Encounter (Signed)
Please advise if rx is ok to send. Pt sched 11/8

## 2021-05-22 ENCOUNTER — Ambulatory Visit: Payer: Self-pay | Admitting: Family Medicine

## 2021-05-22 NOTE — Progress Notes (Signed)
Dear Mr. Granlund,  We find it necessary to inform you that [facility] [place] and All  Primary Care Practices/Providers will longer be able to provide medical care to you.  We believe that our patient/provider relationship has been compromised and thus would request that you seek care elsewhere due to multiple no show visits (08/06/2019, 04/27/2020, 09/25/2020, and 04/19/2021).   We urge you to find another provider to attend to your medical care needs immediately.  You may want to call the local medical society or [facility] Connect (referral service [phone]) for assistance in obtaining a new provider.  Copies of your medical records will be provided to your new provider upon your request.  We will provide for you urgent medical care needs for 30 days from the date of this notice.   If you experience a medical emergency at any time, please go directly to the nearest emergency department.   Respectfully,    Felix Pacini, DO

## 2023-08-25 ENCOUNTER — Encounter: Payer: Self-pay | Admitting: Nurse Practitioner

## 2023-08-25 ENCOUNTER — Ambulatory Visit (INDEPENDENT_AMBULATORY_CARE_PROVIDER_SITE_OTHER): Payer: No Typology Code available for payment source | Admitting: Nurse Practitioner

## 2023-08-25 VITALS — BP 190/103 | HR 101 | Temp 98.5°F | Ht 68.0 in | Wt 187.8 lb

## 2023-08-25 DIAGNOSIS — E119 Type 2 diabetes mellitus without complications: Secondary | ICD-10-CM

## 2023-08-25 DIAGNOSIS — Z1211 Encounter for screening for malignant neoplasm of colon: Secondary | ICD-10-CM

## 2023-08-25 DIAGNOSIS — I10 Essential (primary) hypertension: Secondary | ICD-10-CM

## 2023-08-25 DIAGNOSIS — R079 Chest pain, unspecified: Secondary | ICD-10-CM | POA: Insufficient documentation

## 2023-08-25 DIAGNOSIS — E785 Hyperlipidemia, unspecified: Secondary | ICD-10-CM | POA: Diagnosis not present

## 2023-08-25 DIAGNOSIS — Z7984 Long term (current) use of oral hypoglycemic drugs: Secondary | ICD-10-CM

## 2023-08-25 DIAGNOSIS — Z125 Encounter for screening for malignant neoplasm of prostate: Secondary | ICD-10-CM

## 2023-08-25 DIAGNOSIS — Z0001 Encounter for general adult medical examination with abnormal findings: Secondary | ICD-10-CM | POA: Insufficient documentation

## 2023-08-25 DIAGNOSIS — I2510 Atherosclerotic heart disease of native coronary artery without angina pectoris: Secondary | ICD-10-CM | POA: Diagnosis not present

## 2023-08-25 LAB — LIPID PANEL

## 2023-08-25 LAB — BAYER DCA HB A1C WAIVED: HB A1C (BAYER DCA - WAIVED): 11.3 % — ABNORMAL HIGH (ref 4.8–5.6)

## 2023-08-25 MED ORDER — ASPIRIN 81 MG PO TBEC: 81.0000 mg | DELAYED_RELEASE_TABLET | Freq: Every day | ORAL | Status: DC

## 2023-08-25 MED ORDER — METFORMIN HCL 1000 MG PO TABS: 1000.0000 mg | ORAL_TABLET | Freq: Two times a day (BID) | ORAL | 0 refills | Status: DC

## 2023-08-25 MED ORDER — FENOFIBRATE 48 MG PO TABS: 48.0000 mg | ORAL_TABLET | Freq: Every day | ORAL | 0 refills | Status: DC

## 2023-08-25 MED ORDER — EPINEPHRINE 0.3 MG/0.3ML IJ SOAJ
0.3000 mg | INTRAMUSCULAR | 1 refills | Status: AC | PRN
Start: 2023-08-25 — End: ?

## 2023-08-25 MED ORDER — AMLODIPINE BESYLATE 5 MG PO TABS: 5.0000 mg | ORAL_TABLET | Freq: Every day | ORAL | 0 refills | Status: DC

## 2023-08-25 MED ORDER — LISINOPRIL-HYDROCHLOROTHIAZIDE 20-25 MG PO TABS: 1.0000 | ORAL_TABLET | Freq: Every day | ORAL | 0 refills | Status: DC

## 2023-08-25 MED ORDER — SEMAGLUTIDE(0.25 OR 0.5MG/DOS) 2 MG/3ML ~~LOC~~ SOPN: 0.5000 mg | PEN_INJECTOR | SUBCUTANEOUS | 2 refills | Status: DC

## 2023-08-25 NOTE — Progress Notes (Addendum)
 New Patient Office Visit  Subjective   Patient ID: Stephen Lucero, male    DOB: 09-03-1965  Age: 58 y.o. MRN: [id number]  CC:  Chief Complaint  Patient presents with   Establish Care    BP, BS, hernia    HPI Stephen Lucero 58 year old male presents August 25, 2023 to establish care, and a new concern of chest pain on exertion Past medical history of hypertension, hyperlipidemia, diabetes type 2, CAD in native artery  Chest pain on exertion new problem: Client is a very poor historian reports that he has not been seen by Dr. Within the last 8 years "I was going to the clinic, missed my appointment and was discharged".  He has a history of CAD in native artery and complaining of chest pain on exertion for the last 2 years.  Reports that he was never seen by a cardiologist after his diagnosis.  He was being managed with nitro and aspirin  daily which she reports that he has not take for at least 8. Per client last episode of chest pain was last Saturday " interment chest pain with ligting " once I stopped it got better".  EKG at the office sinus Rhythm -Left axis -anterior fascicular block. -Left atrial enlargement. Old infarct.  -Poor R-wave progression -nonspecific -consider old anterior infarct.  Client is asymptomatic at the office he is instructed to go to the ED if experiencing any chest pain.  Urgent cardiology referral placed for client  HTN He has a diagnosis of hypertension and has been off his medication for 8 years "despise taking all the medication my blood pressure was still high".  He denied syncope, dizziness will order CMP and restart him on hypertensive management  DM2 The patient is a 58 year old male with a history of type 2 diabetes mellitus (DM2). Reports that he has not been on any diabetes medication for the past 8 years, explaining that he ran out of medication and has not followed up for treatment. Recently, he has been experiencing symptoms suggestive of poor  glycemic control, including increased thirst, frequent urination, and fatigue. His current HbA1c is 11.2%, indicating poor glycemic control. Given the severity of hyperglycemia, the plan is to restart metformin  and also add Ozempic  (semaglutide ) to help manage her blood sugar levels. He has not had any recent changes in her diet or physical activity, and she denies any episodes of hypoglycemia.  Hyperlipidemia 58 year old male with a history of hyperlipidemia who presents with complaints of chest pain on exertion. He reports that the chest pain occurs during physical activity, such as walking or climbing stairs, and resolves with rest. The pain is described as a tightness or pressure in the chest, and he denies any associated shortness of breath, dizziness, or palpitations. The patient has not been taking any medications for hyperlipidemia for the past 8 years. Given his history and symptoms, the plan is to start Tricor  (fenofibrate ) to manage his lipid levels, and the patient will also undergo an EKG to assess his heart's electrical activity. A referral to cardiology will be made for further evaluation, including possible stress testing or imaging if deemed necessary.  Maintenance: PSA and colonoscopy Cologuard ordered today     08/25/2023    9:07 AM 05/29/2020   10:22 AM 09/13/2019    1:17 PM  PHQ9 SCORE ONLY  PHQ-9 Total Score 0 0 0    Outpatient Encounter Medications as of 08/25/2023  Medication Sig   amLODipine  (NORVASC ) 5 MG tablet Take 1 tablet (  5 mg total) by mouth daily.   aspirin  EC 81 MG tablet Take 1 tablet (81 mg total) by mouth daily. Swallow whole.   fenofibrate  (TRICOR ) 48 MG tablet Take 1 tablet (48 mg total) by mouth daily.   lisinopril -hydrochlorothiazide  (ZESTORETIC ) 20-25 MG tablet Take 1 tablet by mouth daily.   Semaglutide ,0.25 or 0.5MG /DOS, 2 MG/3ML SOPN Inject 0.5 mg into the skin every 7 (seven) days.   EPINEPHrine  0.3 mg/0.3 mL IJ SOAJ injection Inject 0.3 mg into the  muscle as needed for anaphylaxis.   famotidine  (PEPCID ) 20 MG tablet Take by mouth. (Patient not taking: Reported on 08/25/2023)   loratadine (CLARITIN) 10 MG tablet Take 10 mg by mouth daily. (Patient not taking: Reported on 08/25/2023)   metFORMIN  (GLUCOPHAGE ) 1000 MG tablet Take 1 tablet (1,000 mg total) by mouth 2 (two) times daily with a meal.   nitroGLYCERIN  (NITROSTAT ) 0.4 MG SL tablet Place 1 tablet (0.4 mg total) under the tongue every 5 (five) minutes as needed for chest pain. Reported on 11/27/2015 (Patient not taking: Reported on 08/25/2023)   [DISCONTINUED] amLODipine  (NORVASC ) 10 MG tablet Take 1 tablet (10 mg total) by mouth daily. (Patient not taking: Reported on 08/25/2023)   [DISCONTINUED] aspirin  EC 81 MG tablet Take 81 mg by mouth daily. (Patient not taking: Reported on 08/25/2023)   [DISCONTINUED] EPINEPHrine  0.3 mg/0.3 mL IJ SOAJ injection Inject into the muscle. (Patient not taking: Reported on 08/25/2023)   [DISCONTINUED] glipiZIDE  (GLUCOTROL ) 5 MG tablet Take 1 tablet (5 mg total) by mouth 2 (two) times daily before a meal. (Patient not taking: Reported on 08/25/2023)   [DISCONTINUED] lisinopril  (ZESTRIL ) 40 MG tablet Take 1 tablet (40 mg total) by mouth daily. (Patient not taking: Reported on 08/25/2023)   [DISCONTINUED] metFORMIN  (GLUCOPHAGE ) 1000 MG tablet Take 1 tablet (1,000 mg total) by mouth 2 (two) times daily with a meal. (Patient not taking: Reported on 08/25/2023)   [DISCONTINUED] simvastatin  (ZOCOR ) 40 MG tablet Take 1 tablet (40 mg total) by mouth at bedtime. (Patient not taking: Reported on 08/25/2023)   No facility-administered encounter medications on file as of 08/25/2023.    Past Medical History:  Diagnosis Date   Allergy    Diabetes mellitus without complication (HCC)    GERD (gastroesophageal reflux disease)    High cholesterol    Hypercalcemia 10/06/2017   Hypertension     Past Surgical History:  Procedure Laterality Date   CARDIAC CATHETERIZATION       Family History  Problem Relation Age of Onset   Hypertension Mother    Alcohol abuse Father    Cirrhosis Father    Early death Father 49       cirrhosis    Cancer Maternal Aunt    Heart disease Paternal Grandfather    Hyperlipidemia Paternal Grandfather    Hypertension Paternal Grandfather     Social History   Socioeconomic History   Marital status: Single    Spouse name: Not on file   Number of children: Not on file   Years of education: Not on file   Highest education level: Not on file  Occupational History   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 No children.   11th grade education.    Wears seatbelt   Regular diet.    Smoke detector in the home.    Feels safe in relationship.    No firearms in the home.    Social Drivers of Corporate investment banker Strain: Low Risk  (08/25/2023)   Overall Financial Resource Strain (CARDIA)    Difficulty of Paying Living Expenses: Not very hard  Food Insecurity: No Food Insecurity (08/25/2023)   Hunger Vital Sign    Worried About Running Out of Food in the Last Year: Never true    Ran Out of Food in the Last Year: Never true  Transportation Needs: No Transportation Needs (08/25/2023)   PRAPARE - Administrator, Civil Service (Medical): No    Lack of Transportation (Non-Medical): No  Physical Activity: Unknown (08/25/2023)   Exercise Vital Sign    Days of Exercise per Week: 7 days    Minutes of Exercise per Session: Patient unable to answer  Stress: Stress Concern Present (08/25/2023)   [employer] of  Occupational Health - Occupational Stress Questionnaire    Feeling of Stress : Very much  Social Connections: Socially Isolated (08/25/2023)   Social Connection and Isolation Panel [NHANES]    Frequency of Communication with Friends and Family: More than three times a week    Frequency of Social Gatherings with Friends and Family: More than three times a week    Attends Religious Services: Never    Database administrator or Organizations: No    Attends Banker Meetings: Never    Marital Status: Never married  Intimate Partner Violence: Not At Risk (08/25/2023)   Humiliation, Afraid, Rape, and Kick questionnaire    Fear of Current or Ex-Partner: No    Emotionally Abused: No    Physically Abused: No    Sexually Abused: No    Review of Systems  Constitutional:  Negative for chills and fever.  HENT:  Negative for ear pain and sore throat.   Eyes:  Negative for double vision and photophobia.  Respiratory:  Negative for cough, shortness of breath and wheezing.   Cardiovascular:  Positive for chest pain.       On exertion  Gastrointestinal:  Negative for constipation, diarrhea, nausea and vomiting.  Genitourinary:  Negative for frequency and urgency.  Musculoskeletal:  Negative for falls and myalgias.  Neurological:  Negative for dizziness and headaches.  Endo/Heme/Allergies:  Positive for polydipsia. Negative for environmental allergies. Does not bruise/bleed easily.  Psychiatric/Behavioral:  Negative for suicidal ideas. The patient does not have insomnia.    Negative unless indicated in HPI    Objective   BP (!) 190/103   Pulse (!) 101   Temp 98.5 F (36.9 C) (Oral)   Ht 5\' 8"  (1.727 m)   Wt 187 lb 12.8 oz (85.2 kg)   SpO2 96%   BMI 28.55 kg/m   Physical Exam Vitals and nursing note reviewed.  Constitutional:      General: He is not in acute distress.    Appearance: He is obese.  HENT:     Head: Normocephalic and atraumatic.     Right Ear: Tympanic  membrane, ear canal and external ear normal. There is no impacted cerumen.     Left Ear: Tympanic membrane, ear canal and  external ear normal. There is no impacted cerumen.     Nose: Nose normal.     Mouth/Throat:     Mouth: Mucous membranes are moist.  Eyes:     General: No scleral icterus.    Extraocular Movements: Extraocular movements intact.     Conjunctiva/sclera: Conjunctivae normal.     Pupils: Pupils are equal, round, and reactive to light.  Neck:     Vascular: No carotid bruit.  Cardiovascular:     Rate and Rhythm: Normal rate and regular rhythm.  Pulmonary:     Effort: Pulmonary effort is normal.     Breath sounds: Normal breath sounds.  Abdominal:     General: Bowel sounds are normal.     Palpations: Abdomen is soft.  Musculoskeletal:        General: Normal range of motion.     Cervical back: Normal range of motion and neck supple. No rigidity or tenderness.     Right lower leg: No edema.     Left lower leg: No edema.  Lymphadenopathy:     Cervical: No cervical adenopathy.  Skin:    General: Skin is warm and dry.     Findings: No rash.  Neurological:     Mental Status: He is alert and oriented to person, place, and time.     Gait: Gait is intact.  Psychiatric:        Mood and Affect: Mood normal.        Behavior: Behavior normal.        Thought Content: Thought content normal.        Judgment: Judgment normal.     Last CBC Lab Results  Component Value Date   WBC 6.1 10/03/2017   HGB 16.2 10/03/2017   HCT 47.2 10/03/2017   MCV 90.2 10/03/2017   MCH 30.1 08/22/2015   RDW 13.0 10/03/2017   PLT 223.0 10/03/2017   Last metabolic panel Lab Results  Component Value Date   GLUCOSE 396 (H) 09/13/2019   NA 132 (L) 09/13/2019   K 4.1 09/13/2019   CL 94 (L) 09/13/2019   CO2 28 09/13/2019   BUN 15 09/13/2019   CREATININE 1.07 09/13/2019   GFR 71.49 09/13/2019   CALCIUM  9.7 09/13/2019   PROT 8.0 10/03/2017   ALBUMIN 4.8 10/03/2017   BILITOT 0.5  10/03/2017   ALKPHOS 47 10/03/2017   AST 22 10/03/2017   ALT 36 10/03/2017   Last lipids Lab Results  Component Value Date   CHOL 233 (H) 04/24/2016   HDL 51.30 04/24/2016   LDLCALC 165 (H) 04/24/2016   LDLDIRECT 167.0 10/03/2017   TRIG 86.0 04/24/2016   CHOLHDL 5 04/24/2016   Last hemoglobin A1c Lab Results  Component Value Date   HGBA1C 8.2 (A) 05/29/2020   HGBA1C 8.2 05/29/2020   HGBA1C 8.2 (A) 05/29/2020   HGBA1C 8.2 (A) 05/29/2020   Last thyroid  functions Lab Results  Component Value Date   TSH 1.30 10/03/2017        Assessment & Plan:  Chest pain on exertion -     EKG 12-Lead  CAD in native artery -     Aspirin ; Take 1 tablet (81 mg total) by mouth daily. Swallow whole. -     Ambulatory referral to Cardiology  diabetes type 2 -     Bayer DCA Hb A1c Waived -     Microalbumin / creatinine urine ratio -     metFORMIN  HCl; Take 1 tablet (1,000 mg total) by mouth  2 (two) times daily with a meal.  Dispense: 180 tablet; Refill: 0 -     Semaglutide (0.25 or 0.5MG /DOS); Inject 0.5 mg into the skin every 7 (seven) days.  Dispense: 3 mL; Refill: 2  Hyperlipidemia, unspecified hyperlipidemia type -     Lipid panel -     Fenofibrate ; Take 1 tablet (48 mg total) by mouth daily.  Dispense: 90 tablet; Refill: 0  Screening PSA (prostate specific antigen) -     PSA, total and free  Essential hypertension, benign -     CMP14+EGFR -     Thyroid  Panel With TSH -     Lisinopril -hydroCHLOROthiazide ; Take 1 tablet by mouth daily.  Dispense: 90 tablet; Refill: 0 -     amLODIPine  Besylate; Take 1 tablet (5 mg total) by mouth daily.  Dispense: 90 tablet; Refill: 0  Encounter for general adult medical examination with abnormal findings -     CBC with Differential/Platelet -     CMP14+EGFR -     Lipid panel -     Thyroid  Panel With TSH  Screening for colon cancer -     Cologuard  Other orders -     EPINEPHrine ; Inject 0.3 mg into the muscle as needed for anaphylaxis.   Dispense: 1 each; Refill: 1   Giovanie is a 58 year old Caucasian male seen today to establish care for chronic disease management, no acute distress   Hypertension: Plan to start amlodipine  5 mg daily and Zestoretic  20 to 25 mg daily encourage DASH diet and exercise we will do CMP.  He refuses to come back in a week for nurse visit for BP check but I agreed to check his blood pressure twice daily at home and to check the reading with the clinic  Hyperlipidemia: Will start Tricor  47 mg daily, order lipid  Chest pain on exertion: Will order EKG and urgent referral to cardiology, he is instructed to go to the ED is experiencing any chest pain while waiting to be seen by cardiology  DM2: Foot exam completed hide 2 ordered retinal exam at checkout, A1c 11.2 restart metformin  1000 mg twice daily and add Ozempic  0.5 mg every 7 days, decrease carbohydrate intake  Labs: CBC, CMP, lipid, TSH, PSA, micro result pending Health maintenance: Cologuard ordered client instructed to once received the package to mail it back out within 72 hours.  He refuses shingles vaccine  Encourage healthy lifestyle choices, including diet (rich in fruits, vegetables, and lean proteins, and low in salt and simple carbohydrates) and exercise (at least 30 minutes of moderate physical activity daily).     The above assessment and management plan was discussed with the patient. The patient verbalized understanding of and has agreed to the management plan. Patient is aware to call the clinic if they develop any new symptoms or if symptoms persist or worsen. Patient is aware when to return to the clinic for a follow-up visit. Patient educated on when it is appropriate to go to the emergency department.  Return in about 3 months (around 11/22/2023).   Keni Wafer [place] Thompson, DNP [facility] [address] [place], [place] [address] [phone]  Note: This document was prepared by [employer] voice  dictation technology and any errors that results from this process are unintentional.

## 2023-08-26 ENCOUNTER — Telehealth: Payer: Self-pay | Admitting: Family Medicine

## 2023-08-26 ENCOUNTER — Other Ambulatory Visit: Payer: Self-pay | Admitting: Nurse Practitioner

## 2023-08-26 ENCOUNTER — Telehealth: Payer: Self-pay

## 2023-08-26 ENCOUNTER — Encounter: Payer: Self-pay | Admitting: Nurse Practitioner

## 2023-08-26 ENCOUNTER — Other Ambulatory Visit (HOSPITAL_COMMUNITY): Payer: Self-pay

## 2023-08-26 DIAGNOSIS — I2510 Atherosclerotic heart disease of native coronary artery without angina pectoris: Secondary | ICD-10-CM

## 2023-08-26 LAB — CBC WITH DIFFERENTIAL/PLATELET
Basophils Absolute: 0 10*3/uL (ref 0.0–0.2)
Basos: 1 %
EOS (ABSOLUTE): 0.1 10*3/uL (ref 0.0–0.4)
Eos: 2 %
Hematocrit: 49.9 % (ref 37.5–51.0)
Hemoglobin: 16.9 g/dL (ref 13.0–17.7)
Immature Grans (Abs): 0 10*3/uL (ref 0.0–0.1)
Immature Granulocytes: 0 %
Lymphocytes Absolute: 1.8 10*3/uL (ref 0.7–3.1)
Lymphs: 34 %
MCH: 30.2 pg (ref 26.6–33.0)
MCHC: 33.9 g/dL (ref 31.5–35.7)
MCV: 89 fL (ref 79–97)
Monocytes Absolute: 0.5 10*3/uL (ref 0.1–0.9)
Monocytes: 9 %
Neutrophils Absolute: 2.9 10*3/uL (ref 1.4–7.0)
Neutrophils: 54 %
Platelets: 226 10*3/uL (ref 150–450)
RBC: 5.6 x10E6/uL (ref 4.14–5.80)
RDW: 11.8 % (ref 11.6–15.4)
WBC: 5.4 10*3/uL (ref 3.4–10.8)

## 2023-08-26 LAB — CMP14+EGFR
ALT: 30 IU/L (ref 0–44)
AST: 20 IU/L (ref 0–40)
Albumin: 4.8 g/dL (ref 3.8–4.9)
Alkaline Phosphatase: 88 IU/L (ref 44–121)
BUN/Creatinine Ratio: 14 (ref 9–20)
BUN: 12 mg/dL (ref 6–24)
Bilirubin Total: 0.4 mg/dL (ref 0.0–1.2)
CO2: 23 mmol/L (ref 20–29)
Calcium: 10.5 mg/dL — ABNORMAL HIGH (ref 8.7–10.2)
Chloride: 92 mmol/L — ABNORMAL LOW (ref 96–106)
Creatinine, Ser: 0.86 mg/dL (ref 0.76–1.27)
Globulin, Total: 2.7 g/dL (ref 1.5–4.5)
Glucose: 332 mg/dL — ABNORMAL HIGH (ref 70–99)
Potassium: 4.4 mmol/L (ref 3.5–5.2)
Sodium: 135 mmol/L (ref 134–144)
Total Protein: 7.5 g/dL (ref 6.0–8.5)
eGFR: 100 mL/min/{1.73_m2} (ref >59)

## 2023-08-26 LAB — THYROID PANEL WITH TSH
Free Thyroxine Index: 2.4 (ref 1.2–4.9)
T3 Uptake Ratio: 31 % (ref 24–39)
T4, Total: 7.8 ug/dL (ref 4.5–12.0)
TSH: 1.19 u[IU]/mL (ref 0.450–4.500)

## 2023-08-26 LAB — LIPID PANEL
Cholesterol, Total: 266 mg/dL — ABNORMAL HIGH (ref 100–199)
HDL: 43 mg/dL (ref >39)
LDL CALC COMMENT:: 6.2 ratio — ABNORMAL HIGH (ref 0.0–5.0)
LDL Chol Calc (NIH): 172 mg/dL — ABNORMAL HIGH (ref 0–99)
Triglycerides: 271 mg/dL — ABNORMAL HIGH (ref 0–149)
VLDL Cholesterol Cal: 51 mg/dL — ABNORMAL HIGH (ref 5–40)

## 2023-08-26 LAB — PSA, TOTAL AND FREE
PSA, Free Pct: 21.3 %
PSA, Free: 0.17 ng/mL
Prostate Specific Ag, Serum: 0.8 ng/mL (ref 0.0–4.0)

## 2023-08-26 LAB — MICROALBUMIN / CREATININE URINE RATIO
Creatinine, Urine: 48.9 mg/dL
Microalb/Creat Ratio: 47 mg/g{creat} — ABNORMAL HIGH (ref 0–29)
Microalbumin, Urine: 23 ug/mL

## 2023-08-26 MED ORDER — NITROGLYCERIN 0.4 MG SL SUBL: 0.4000 mg | SUBLINGUAL_TABLET | SUBLINGUAL | 0 refills | Status: AC | PRN

## 2023-08-26 NOTE — Telephone Encounter (Unsigned)
Copied from CRM [id number]. Topic: Clinical - Prescription Issue >> Aug 26, 2023 10:34 AM Elle L wrote: Reason for CRM: The patient needs a prior authorization Semaglutide,0.25 or 0.5MG /DOS, 2 MG/3ML SOPN. He also advised that all future prescriptions go to [id number] - [place], [place] - [address] Phone: [phone] Fax: [phone].

## 2023-08-26 NOTE — Telephone Encounter (Signed)
PA request has been Started. New Encounter created for follow up. For additional info see Pharmacy Prior Auth telephone encounter from 08/26/23.

## 2023-08-26 NOTE — Telephone Encounter (Signed)
[facility] [place] and spoke with the pharmacist and she as able to process the claim and it has a $15 copay and she said she would work on getting it ready for him.

## 2023-08-26 NOTE — Telephone Encounter (Unsigned)
Copied from CRM [id number]. Topic: Clinical - Medication Question >> Aug 26, 2023  4:16 PM Gery Pray wrote: Reason for CRM: Patient calling in regarding the prior authorization for Ozempic. Seen a message that stated that is not required but another one stated an authorization is required. Patient was informed that a PA was needed in order to fill the prescription. Patient is expecting a call back at [phone]. [phone] [place], [place] - [address] [place] [address] Phone: [phone] Fax: [phone] Hours: Not open 24 hours

## 2023-08-26 NOTE — Telephone Encounter (Signed)
Pharmacy Patient Advocate Encounter   Received notification from Pt Calls Messages that prior authorization for Ozempic (0.25 or 0.5 MG/DOSE) 2MG /3ML pen-injectors is required/requested.   Insurance verification completed.   The patient is insured through  [facility] [employer]  .   Per test claim: PA required and submitted KEY/EOC/Request #:  [id number] CANCELLED due to Prior Authorization Not Required  Per test claim: The current 28 day co-pay is, $15.  No PA needed at this time. This test claim was processed through [facility]- copay amounts may vary at other pharmacies due to pharmacy/plan contracts, or as the patient moves through the different stages of their insurance plan.

## 2023-08-26 NOTE — Telephone Encounter (Signed)
Copied from CRM [id number]. Topic: Clinical - Medication Refill >> Aug 26, 2023 10:32 AM Elle L wrote: Most Recent Primary Care Visit:  Provider: [facility] Santa Lighter, [place]  Department: [facility]  Visit Type: NEW PT - OFFICE VISIT  Date: 08/25/2023  Medication: nitroGLYCERIN (NITROSTAT) 0.4 MG SL tablet  Has the patient contacted their pharmacy? Yes  Is this the correct pharmacy for this prescription? Yes  This is the patient's preferred pharmacy:   [id number] [place], [place] - [address] [place] [address] Phone: [phone] Fax: [phone]  Has the prescription been filled recently? No  Is the patient out of the medication? Yes  Has the patient been seen for an appointment in the last year OR does the patient have an upcoming appointment? Yes  Can we respond through MyChart? No  Agent: Please be advised that Rx refills may take up to 3 business days. We ask that you follow-up with your pharmacy.

## 2023-09-16 ENCOUNTER — Telehealth: Payer: Self-pay

## 2023-09-16 NOTE — Telephone Encounter (Signed)
 Copied from CRM [id number]. Topic: Referral - Status >> Sep 16, 2023  9:57 AM Everette C wrote: Reason for CRM: The patient has made a call to follow up on previous discussions from roughly three weeks ago about a referral to imaging/radiology for an xray  The patient would like to know where and when they'll be having their imaging   Please contact the patient further when possible

## 2023-09-17 ENCOUNTER — Ambulatory Visit: Payer: Self-pay | Admitting: Nurse Practitioner

## 2023-09-17 NOTE — Telephone Encounter (Signed)
 Chief Complaint: Eye injury  Symptoms: Left eye injury, running, blurred vision, mild pain  Frequency: Constant  Pertinent Negatives: Patient denies other symptoms  Disposition: [] ED /[] Urgent Care (no appt availability in office) / [x] Appointment(In office/virtual)/ []  [facility]/ [] Home Care/ [] Refused Recommended Disposition /[]  Mobile Bus/ []  Follow-up with PCP Additional Notes: Patient states he was working on a car about 2 weeks ago and believes a piece of metal got in his eye. Patient states he has tried to flush the eye with water and eye drops. Patient states it still feels like something is in there. Care advice was given and patient was offered an appointment with PCP today but patient stated he can't get off work today but can come in tomorrow. Patient has been scheduled with PCP tomorrow morning.   Copied from CRM [id number]. Topic: Clinical - Red Word Triage >> Sep 17, 2023  9:52 AM Fuller Mandril wrote: Red Word that prompted transfer to Nurse Triage: Metal in eye, pain and loss of vision in sunlight, running constantly Reason for Disposition  Constant tearing or blinking  Answer Assessment - Initial Assessment Questions 1. MECHANISM: "How did the injury happen?"      I feel like a piece of metal hit me in the eye when I was working on a car 2. ONSET: "When did the injury happen?" (Minutes or hours ago)      @ weeks ago  3. LOCATION: "What part of the eye is injured?" (cornea, sclera, eyelid, or periorbital tissue)     Left eye  4. APPEARANCE: "What does the eye look like?"      Yes  5. VISION: "Is the vision blurred?"      Yes  6. PAIN: "Is it painful?" If Yes, ask: "How bad is the pain?"   (e.g., Scale 1-10; or mild, moderate, severe)     Mild  7. SIZE: For cuts, bruises, or swelling, ask: "How large is it?" (e.g., inches or centimeters)      No  8. TETANUS: For any breaks in the skin, ask: "When was the last tetanus booster?"     Unsure  9. CONTACTS:  "Do you wear contacts?"     No  10. OTHER SYMPTOMS: "Do you have any other symptoms?" (e.g., headache, neck pain, vomiting)       Running eye, blurred vision, pain  Protocols used: Eye Injury-A-AH

## 2023-09-18 ENCOUNTER — Encounter: Payer: Self-pay | Admitting: Nurse Practitioner

## 2023-09-18 ENCOUNTER — Ambulatory Visit (INDEPENDENT_AMBULATORY_CARE_PROVIDER_SITE_OTHER): Admitting: Nurse Practitioner

## 2023-09-18 VITALS — BP 160/85 | HR 84 | Temp 97.5°F | Ht 68.0 in | Wt 190.8 lb

## 2023-09-18 DIAGNOSIS — S0592XA Unspecified injury of left eye and orbit, initial encounter: Secondary | ICD-10-CM

## 2023-09-18 NOTE — Progress Notes (Signed)
   Acute Office Visit  Subjective:     Patient ID: Stephen Lucero, male    DOB: 07/01/1966, 58 y.o.   MRN: [id number]  Chief Complaint  Patient presents with   Eye Pain    Left eye pain for 2 weeks, was grinding on a car and a piece of metal hit him in eye.     HPI Stephen Lucero was not seen ROS Negative unless indicated in HPI    Objective:    BP (!) 160/85   Pulse 84   Temp (!) 97.5 F (36.4 C) (Temporal)   Ht 5\' 8"  (1.727 m)   Wt 190 lb 12.8 oz (86.5 kg)   SpO2 95%   BMI 29.01 kg/m  BP Readings from Last 3 Encounters:  09/18/23 (!) 160/85  08/25/23 (!) 190/103  05/29/20 (!) 197/102   Wt Readings from Last 3 Encounters:  09/18/23 190 lb 12.8 oz (86.5 kg)  08/25/23 187 lb 12.8 oz (85.2 kg)  05/29/20 204 lb (92.5 kg)      Physical Exam  No results found for any visits on 09/18/23.      Assessment & Plan:  There are no diagnoses linked to this encounter.  No follow-ups on file.  Arrie Aran Santa Lighter, [place] [facility] [address] [place], [place] [address] [phone]  Note: This document was prepared by [employer] voice dictation technology and any errors that results from this process are unintentional.

## 2023-09-19 NOTE — Telephone Encounter (Signed)
 Pt will have his DM eye exam at his next visit with PCP on 11/25/23.

## 2023-10-28 ENCOUNTER — Encounter: Payer: Self-pay | Admitting: Nurse Practitioner

## 2023-10-28 ENCOUNTER — Ambulatory Visit (INDEPENDENT_AMBULATORY_CARE_PROVIDER_SITE_OTHER): Admitting: Nurse Practitioner

## 2023-10-28 ENCOUNTER — Emergency Department (HOSPITAL_COMMUNITY)

## 2023-10-28 ENCOUNTER — Encounter (HOSPITAL_COMMUNITY): Payer: Self-pay | Admitting: Emergency Medicine

## 2023-10-28 ENCOUNTER — Ambulatory Visit: Payer: Self-pay

## 2023-10-28 ENCOUNTER — Other Ambulatory Visit: Payer: Self-pay

## 2023-10-28 ENCOUNTER — Emergency Department (HOSPITAL_COMMUNITY)
Admission: EM | Admit: 2023-10-28 | Discharge: 2023-10-28 | Disposition: A | Discharge status: Home / Self Care | Attending: Emergency Medicine | Admitting: Emergency Medicine

## 2023-10-28 VITALS — BP 160/90 | HR 107 | Temp 98.1°F | Ht 68.0 in | Wt 190.4 lb

## 2023-10-28 DIAGNOSIS — Z7982 Long term (current) use of aspirin: Secondary | ICD-10-CM | POA: Diagnosis not present

## 2023-10-28 DIAGNOSIS — Z79899 Other long term (current) drug therapy: Secondary | ICD-10-CM | POA: Diagnosis not present

## 2023-10-28 DIAGNOSIS — R000 Tachycardia, unspecified: Secondary | ICD-10-CM | POA: Diagnosis not present

## 2023-10-28 DIAGNOSIS — R251 Tremor, unspecified: Secondary | ICD-10-CM | POA: Diagnosis present

## 2023-10-28 DIAGNOSIS — E119 Type 2 diabetes mellitus without complications: Secondary | ICD-10-CM | POA: Diagnosis not present

## 2023-10-28 DIAGNOSIS — I10 Essential (primary) hypertension: Secondary | ICD-10-CM | POA: Insufficient documentation

## 2023-10-28 DIAGNOSIS — I2510 Atherosclerotic heart disease of native coronary artery without angina pectoris: Secondary | ICD-10-CM | POA: Diagnosis not present

## 2023-10-28 DIAGNOSIS — R002 Palpitations: Secondary | ICD-10-CM

## 2023-10-28 DIAGNOSIS — R0602 Shortness of breath: Secondary | ICD-10-CM | POA: Diagnosis not present

## 2023-10-28 DIAGNOSIS — Z7984 Long term (current) use of oral hypoglycemic drugs: Secondary | ICD-10-CM | POA: Insufficient documentation

## 2023-10-28 DIAGNOSIS — R0789 Other chest pain: Secondary | ICD-10-CM | POA: Diagnosis not present

## 2023-10-28 DIAGNOSIS — R079 Chest pain, unspecified: Secondary | ICD-10-CM

## 2023-10-28 LAB — CBC
HCT: 45.6 % (ref 39.0–52.0)
Hemoglobin: 16 g/dL (ref 13.0–17.0)
MCH: 30.1 pg (ref 26.0–34.0)
MCHC: 35.1 g/dL (ref 30.0–36.0)
MCV: 85.9 fL (ref 80.0–100.0)
Platelets: 254 10*3/uL (ref 150–400)
RBC: 5.31 MIL/uL (ref 4.22–5.81)
RDW: 12.1 % (ref 11.5–15.5)
WBC: 7.4 10*3/uL (ref 4.0–10.5)
nRBC: 0 % (ref 0.0–0.2)

## 2023-10-28 LAB — BASIC METABOLIC PANEL WITH GFR
Anion gap: 12 (ref 5–15)
BUN: 14 mg/dL (ref 6–20)
CO2: 25 mmol/L (ref 22–32)
Calcium: 10.1 mg/dL (ref 8.9–10.3)
Chloride: 95 mmol/L — ABNORMAL LOW (ref 98–111)
Creatinine, Ser: 0.7 mg/dL (ref 0.61–1.24)
GFR, Estimated: >60 mL/min (ref >60)
Glucose, Bld: 180 mg/dL — ABNORMAL HIGH (ref 70–99)
Potassium: 4 mmol/L (ref 3.5–5.1)
Sodium: 132 mmol/L — ABNORMAL LOW (ref 135–145)

## 2023-10-28 LAB — HEPATIC FUNCTION PANEL
ALT: 29 U/L (ref 0–44)
AST: 20 U/L (ref 15–41)
Albumin: 4.8 g/dL (ref 3.5–5.0)
Alkaline Phosphatase: 56 U/L (ref 38–126)
Bilirubin, Direct: 0.1 mg/dL (ref 0.0–0.2)
Indirect Bilirubin: 0.5 mg/dL (ref 0.3–0.9)
Total Bilirubin: 0.6 mg/dL (ref 0.0–1.2)
Total Protein: 8.4 g/dL — ABNORMAL HIGH (ref 6.5–8.1)

## 2023-10-28 LAB — TROPONIN I (HIGH SENSITIVITY)
Troponin I (High Sensitivity): 7 ng/L (ref <18)
Troponin I (High Sensitivity): 7 ng/L (ref <18)

## 2023-10-28 LAB — MAGNESIUM: Magnesium: 1.9 mg/dL (ref 1.7–2.4)

## 2023-10-28 LAB — CBG MONITORING, ED: Glucose-Capillary: 171 mg/dL — ABNORMAL HIGH (ref 70–99)

## 2023-10-28 NOTE — ED Provider Notes (Signed)
 [place] EMERGENCY DEPARTMENT AT [facility] Provider Note   CSN: [id number] Arrival date & time: 10/28/23  1302     History {Add pertinent medical, surgical, social history, OB history to HPI:1} Chief Complaint  Patient presents with   Shaking    Abnormal EKG @ PCP    Stephen Lucero is a 58 y.o. male.  He has a history of diabetes hypertension hypercholesterolemia.  He had not seen a doctor for a while and when he was seen in January they started him on metformin semaglutide and blood pressure medicine.  He said for the last few days he has had increased shaking in his hands.  Went to his primary care doctor today and they referred him here for further evaluation.  He also endorses some shortness of breath and some chest discomfort when he exerts himself.  This has been on and off for a year.  He has also had chronic loose stools since January when he started on the metformin.  He denies any tobacco use.  He endorses daily alcohol use, used to drink a case a day and now drinks 6-12 beers, has been cutting down.  No abdominal pain or current chest pain.  No cough or shortness of breath currently.  No fevers or chills.  No numbness or weakness.  The history is provided by the patient.  Neurologic Problem This is a new problem. The current episode started more than 2 days ago. The problem has not changed since onset.Associated symptoms include chest pain and shortness of breath. Pertinent negatives include no abdominal pain and no headaches. Nothing aggravates the symptoms. Nothing relieves the symptoms. He has tried rest for the symptoms. The treatment provided no relief.       Home Medications Prior to Admission medications   Medication Sig Start Date End Date Taking? Authorizing Provider  erythromycin ophthalmic ointment SMARTSIG:1 Inch(es) In Eye(s) Every Night 09/19/23  Yes [provider]  tobramycin (TOBREX) 0.3 % ophthalmic solution Place 1 drop into the left eye  4 (four) times daily. 09/19/23  Yes [provider]  amLODipine (NORVASC) 5 MG tablet Take 1 tablet (5 mg total) by mouth daily. 08/25/23   St Annice Kim, NP  aspirin EC 81 MG tablet Take 1 tablet (81 mg total) by mouth daily. Swallow whole. 08/25/23   St Annice Kim, NP  EPINEPHrine 0.3 mg/0.3 mL IJ SOAJ injection Inject 0.3 mg into the muscle as needed for anaphylaxis. 08/25/23   St Annice Kim, NP  famotidine (PEPCID) 20 MG tablet Take by mouth. 03/21/20   [provider]  fenofibrate (TRICOR) 48 MG tablet Take 1 tablet (48 mg total) by mouth daily. 08/25/23   St Annice Kim, NP  lisinopril-hydrochlorothiazide (ZESTORETIC) 20-25 MG tablet Take 1 tablet by mouth daily. 08/25/23   St Annice Kim, NP  loratadine (CLARITIN) 10 MG tablet Take 10 mg by mouth daily.    [provider]  metFORMIN (GLUCOPHAGE) 1000 MG tablet Take 1 tablet (1,000 mg total) by mouth 2 (two) times daily with a meal. 08/25/23   St Verma Gobble, Adell Hones, NP  nitroGLYCERIN (NITROSTAT) 0.4 MG SL tablet Place 1 tablet (0.4 mg total) under the tongue every 5 (five) minutes as needed for chest pain. Reported on 11/27/2015 08/26/23   Anton Baton, NP  Semaglutide,0.25 or 0.5MG /DOS, 2 MG/3ML SOPN Inject 0.5 mg into the skin every 7 (seven) days. 08/25/23   St Annice Kim, NP  Allergies    Lipitor [atorvastatin] and Shellfish-derived products    Review of Systems   Review of Systems  Constitutional:  Negative for fever.  Eyes:  Negative for visual disturbance.  Respiratory:  Positive for shortness of breath.   Cardiovascular:  Positive for chest pain.  Gastrointestinal:  Negative for abdominal pain.  Genitourinary:  Negative for dysuria.  Neurological:  Positive for tremors. Negative for speech difficulty, weakness, numbness and headaches.    Physical Exam Updated Vital Signs BP (!) 172/97   Pulse 98   Temp 99 F (37.2 C)  (Oral)   Resp 15   Ht 5\' 8"  (1.727 m)   Wt 86.2 kg   SpO2 94%   BMI 28.89 kg/m  Physical Exam Vitals and nursing note reviewed.  Constitutional:      General: He is not in acute distress.    Appearance: Normal appearance. He is well-developed.  HENT:     Head: Normocephalic and atraumatic.  Eyes:     Conjunctiva/sclera: Conjunctivae normal.  Cardiovascular:     Rate and Rhythm: Normal rate and regular rhythm.     Heart sounds: No murmur heard. Pulmonary:     Effort: Pulmonary effort is normal. No respiratory distress.     Breath sounds: Normal breath sounds.  Abdominal:     Palpations: Abdomen is soft.     Tenderness: There is no abdominal tenderness. There is no guarding or rebound.  Musculoskeletal:        General: No deformity.     Cervical back: Neck supple.  Skin:    General: Skin is warm and dry.     Capillary Refill: Capillary refill takes less than 2 seconds.  Neurological:     General: No focal deficit present.     Mental Status: He is alert and oriented to person, place, and time.     Cranial Nerves: No cranial nerve deficit.     Sensory: No sensory deficit.     Motor: No weakness.     ED Results / Procedures / Treatments   Labs (all labs ordered are listed, but only abnormal results are displayed) Labs Reviewed  BASIC METABOLIC PANEL WITH GFR - Abnormal; Notable for the following components:      Result Value   Sodium 132 (*)    Chloride 95 (*)    Glucose, Bld 180 (*)    All other components within normal limits  CBG MONITORING, ED - Abnormal; Notable for the following components:   Glucose-Capillary 171 (*)    All other components within normal limits  CBC  HEPATIC FUNCTION PANEL  MAGNESIUM  TROPONIN I (HIGH SENSITIVITY)  TROPONIN I (HIGH SENSITIVITY)    EKG EKG Interpretation Date/Time:  Tuesday October 28 2023 13:12:34 EDT Ventricular Rate:  102 PR Interval:  180 QRS Duration:  82 QT Interval:  344 QTC Calculation: 448 R  Axis:   -50  Text Interpretation: Sinus tachycardia Left axis deviation Septal infarct , age undetermined Inferior infarct , age undetermined ST & T wave abnormality, consider lateral ischemia Abnormal ECG Confirmed by Meridee Score [phone]) on 10/28/2023 3:10:59 PM  Radiology No results found.  Procedures Procedures  {Document cardiac monitor, telemetry assessment procedure when appropriate:1}  Medications Ordered in ED Medications - No data to display  ED Course/ Medical Decision Making/ A&P   {   Click here for ABCD2, HEART and other calculatorsREFRESH Note before signing :1}  Medical Decision Making Amount and/or Complexity of Data Reviewed Labs: ordered. Radiology: ordered.   This patient complains of ***; this involves an extensive number of treatment Options and is a complaint that carries with it a high risk of complications and morbidity. The differential includes ***  I ordered, reviewed and interpreted labs, which included *** I ordered medication *** and reviewed PMP when indicated. I ordered imaging studies which included *** and I independently    visualized and interpreted imaging which showed *** Additional history obtained from *** Previous records obtained and reviewed *** I consulted *** and discussed lab and imaging findings and discussed disposition.  Cardiac monitoring reviewed, *** Social determinants considered, *** Critical Interventions: ***  After the interventions stated above, I reevaluated the patient and found *** Admission and further testing considered, ***   {Document critical care time when appropriate:1} {Document review of labs and clinical decision tools ie heart score, Chads2Vasc2 etc:1}  {Document your independent review of radiology images, and any outside records:1} {Document your discussion with family members, caretakers, and with consultants:1} {Document social determinants of health affecting  pt's care:1} {Document your decision making why or why not admission, treatments were needed:1} Final Clinical Impression(s) / ED Diagnoses Final diagnoses:  None    Rx / DC Orders ED Discharge Orders     None

## 2023-10-28 NOTE — ED Notes (Signed)
 Pt states new meds started about 2 months ago and last 2 weeks he shakes everyday. Pt flushed in face but states that is his normal. Pt states he drinks alcohol every day and last intake was last night. See CIWA. A/o.

## 2023-10-28 NOTE — Progress Notes (Signed)
 Acute Office Visit  Subjective:     Patient ID: Stephen Lucero, male    DOB: 1965/07/29, 58 y.o.   MRN: [id number]  Chief Complaint  Patient presents with   Tremors    Weakness in hands and legs, shaking for 2 weeks    HPI Righteous Claiborne is a 58 year old male with a past medical history of coronary artery disease (CAD) and type 2 diabetes mellitus (DM2) who presents today for an acute visit due to 2 weeks of dyspnea on exertion (DOE), generalized weakness, and tremors. He also reports intermittent chest pain. He has a scheduled cardiology follow-up in August, but due to current symptomatic presentation, evaluation was expedited.  An EKG performed in clinic today showed sinus tachycardia with a heart rate of 106 bpm, PR interval 180 ms, and QT interval 332 ms. Given the abnormal EKG and ongoing symptoms including chest pain, the patient was instructed to proceed immediately to the Emergency Department at [facility] for further evaluation. A report was called in and communicated directly to the ED triage nurse.  HTN: BP is not at goal  Active Ambulatory Problems    Diagnosis Date Noted   Heart palpitations 08/22/2015   Essential hypertension, benign 08/22/2015   Hyperlipidemia 04/25/2016   CAD in native artery 08/16/2016   Noncompliance with diabetes treatment 10/21/2018   Noncompliance 10/21/2018   diabetes type 2 09/14/2019   Encounter for general adult medical examination with abnormal findings 08/25/2023   Chest pain on exertion 08/25/2023   Tachycardia 10/28/2023   Resolved Ambulatory Problems    Diagnosis Date Noted   Encounter to establish care 08/22/2015   Encounter for routine adult medical exam with abnormal findings 08/22/2015   Type 2 diabetes mellitus without complications (HCC) 08/23/2015   Exertional chest pain 08/16/2016   Hypercalcemia 10/06/2017   Past Medical History:  Diagnosis Date   Allergy    Diabetes mellitus without complication (HCC)    GERD  (gastroesophageal reflux disease)    High cholesterol    Hypertension     Review of Systems  Constitutional:  Negative for chills and fever.  Respiratory:  Negative for cough.   Cardiovascular:  Positive for chest pain and palpitations. Negative for leg swelling.       DOE  Gastrointestinal:  Negative for constipation, diarrhea, nausea and vomiting.  Skin:  Negative for itching and rash.  Neurological:  Positive for weakness. Negative for dizziness and headaches.   Negative unless indicated in HPI    Objective:    BP (!) 160/90   Pulse (!) 107   Temp 98.1 F (36.7 C) (Temporal)   Ht 5\' 8"  (1.727 m)   Wt 190 lb 6.4 oz (86.4 kg)   SpO2 97%   BMI 28.95 kg/m  BP Readings from Last 3 Encounters:  10/28/23 (!) 160/90  09/18/23 (!) 160/85  08/25/23 (!) 190/103   Wt Readings from Last 3 Encounters:  10/28/23 190 lb 6.4 oz (86.4 kg)  09/18/23 190 lb 12.8 oz (86.5 kg)  08/25/23 187 lb 12.8 oz (85.2 kg)      Physical Exam Vitals and nursing note reviewed.  HENT:     Head: Normocephalic and atraumatic.     Nose: Nose normal.     Mouth/Throat:     Mouth: Mucous membranes are moist.  Eyes:     General: No scleral icterus.    Extraocular Movements: Extraocular movements intact.     Conjunctiva/sclera: Conjunctivae normal.     Pupils: Pupils  are equal, round, and reactive to light.  Neck:     Vascular: No carotid bruit.  Cardiovascular:     Rate and Rhythm: Tachycardia present.  Pulmonary:     Effort: Pulmonary effort is normal.     Breath sounds: Normal breath sounds. No wheezing.  Abdominal:     General: Bowel sounds are normal.     Palpations: Abdomen is soft. There is no mass.  Musculoskeletal:     Cervical back: Normal range of motion and neck supple. No rigidity or tenderness.  Lymphadenopathy:     Cervical: No cervical adenopathy.  Skin:    General: Skin is warm and dry.     Findings: No rash.  Neurological:     Mental Status: He is alert and oriented to  person, place, and time.  Psychiatric:        Mood and Affect: Mood normal.        Behavior: Behavior normal.        Thought Content: Thought content normal.        Judgment: Judgment normal.     No results found for any visits on 10/28/23.      Assessment & Plan:  Tachycardia -     EKG 12-Lead  CAD in native artery -     EKG 12-Lead  Essential hypertension, benign  Heart palpitations -     EKG 12-Lead   Avon is a 20 yrs Caucasian male seen for chest pain, Abnormal EKG, he is instructed to go [facility]  ED to be seen, report called to triage Nurse HTN: BP not at goal, will address post hospital d/c Return for post hospital d/c.  Ebb Carelock [place] Thompson, DNP [facility] [address] [place], [place] [address] [phone]  Note: This document was prepared by [employer] voice dictation technology and any errors that results from this process are unintentional.

## 2023-10-28 NOTE — ED Triage Notes (Signed)
 Per pt he was sent by PCP for abnormal EKG, pt started new diabetic and blood pressure medicine and every since then he has been having intermittent shaking.

## 2023-10-28 NOTE — Telephone Encounter (Signed)
 Copied from CRM [id number]. Topic: Clinical - Red Word Triage >> Oct 28, 2023  9:12 AM Ivette P wrote: [facility] that prompted transfer to Nurse Triage: tremores and feeling very week  Chief Complaint: hands shaking, legs weak Symptoms: see above Frequency: constant Pertinent Negatives: Patient denies numbness, tingling, fever, cold like symptoms Disposition: [] ED /[] Urgent Care (no appt availability in office) / [x] Appointment(In office/virtual)/ []  [place]/ [] Home Care/ [] Refused Recommended Disposition /[] [facility]/ []  Follow-up with PCP Additional Notes: per protocol apt made for today; care advice given, denies questions; instructed to go to ER if becomes worse.  Reason for Disposition  [1] Weakness of the face, arm / hand, or leg / foot on one side of the body AND [2] gradual onset (e.g., days to weeks) AND [3] present now  Answer Assessment - Initial Assessment Questions 1. SYMPTOM: "What is the main symptom you are concerned about?" (e.g., weakness, numbness)     tremors 2. ONSET: "When did this start?" (minutes, hours, days; while sleeping)     Last week 3. LAST NORMAL: "When was the last time you (the patient) were normal (no symptoms)?"     Last week 4. PATTERN "Does this come and go, or has it been constant since it started?"  "Is it present now?"     "Like I'm nervous" hands shake and weak legs 5. CARDIAC SYMPTOMS: "Have you had any of the following symptoms: chest pain, difficulty breathing, palpitations?"     denies 6. NEUROLOGIC SYMPTOMS: "Have you had any of the following symptoms: headache, dizziness, vision loss, double vision, changes in speech, unsteady on your feet?"     denies 7. OTHER SYMPTOMS: "Do you have any other symptoms?"     denies 8. PREGNANCY: "Is there any chance you are pregnant?" "When was your last menstrual period?"     na  Protocols used: Neurologic Deficit-A-AH

## 2023-10-28 NOTE — Discharge Instructions (Signed)
 Please continue your regular medications and keep your primary care doctor in the loop.  I have put a referral in for cardiology to see if they can expedite your evaluation.  Return to the emergency department if any worsening or concerning symptoms.

## 2023-11-21 ENCOUNTER — Other Ambulatory Visit: Payer: Self-pay | Admitting: Nurse Practitioner

## 2023-11-21 DIAGNOSIS — E119 Type 2 diabetes mellitus without complications: Secondary | ICD-10-CM

## 2023-11-21 DIAGNOSIS — I10 Essential (primary) hypertension: Secondary | ICD-10-CM

## 2023-11-25 ENCOUNTER — Ambulatory Visit: Payer: No Typology Code available for payment source | Admitting: Nurse Practitioner

## 2023-11-26 ENCOUNTER — Telehealth: Payer: Self-pay | Admitting: Nurse Practitioner

## 2023-11-26 NOTE — Telephone Encounter (Signed)
 Copied from CRM [id number]. Topic: Clinical - Medication Refill >> Nov 26, 2023  9:09 AM Carrielelia G wrote: Medication: lisinopril -hydrochlorothiazide  (ZESTORETIC ) 20-25 MG tablet  Has the patient contacted their pharmacy? Yes (Agent: If no, request that the patient contact the pharmacy for the refill. If patient does not wish to contact the pharmacy document the reason why and proceed with request.) (Agent: If yes, when and what did the pharmacy advise?)  This is the patient's preferred pharmacy:  [id number] - [place], [place] - [address] [place] [address] Phone: [phone] Fax: [phone]  Is this the correct pharmacy for this prescription? Yes If no, delete pharmacy and type the correct one.    Is the patient out of the medication? Yes   ( patients need  5 pills until Monday appt)   Has the patient been seen for an appointment in the last year OR does the patient have an upcoming appointment? Yes  Can we respond through MyChart? No  Agent: Please be advised that Rx refills may take up to 3 business days. We ask that you follow-up with your pharmacy.

## 2023-11-26 NOTE — Telephone Encounter (Signed)
 Lisinopril -hydrochlorothiazide  and metformin  [facility]. Called [facility] and confirmed medications have been filled and are waiting for patient to pick up. Attempted to call patient and inform him, no answer and no option to leave a voicemail. Closing encounter.

## 2023-12-01 ENCOUNTER — Encounter: Payer: Self-pay | Admitting: Nurse Practitioner

## 2023-12-01 ENCOUNTER — Ambulatory Visit: Admitting: Nurse Practitioner

## 2023-12-01 VITALS — BP 149/88 | HR 95 | Temp 97.5°F | Ht 68.0 in | Wt 194.2 lb

## 2023-12-01 DIAGNOSIS — E785 Hyperlipidemia, unspecified: Secondary | ICD-10-CM

## 2023-12-01 DIAGNOSIS — I10 Essential (primary) hypertension: Secondary | ICD-10-CM | POA: Diagnosis not present

## 2023-12-01 DIAGNOSIS — E119 Type 2 diabetes mellitus without complications: Secondary | ICD-10-CM

## 2023-12-01 DIAGNOSIS — J309 Allergic rhinitis, unspecified: Secondary | ICD-10-CM | POA: Insufficient documentation

## 2023-12-01 DIAGNOSIS — E1169 Type 2 diabetes mellitus with other specified complication: Secondary | ICD-10-CM

## 2023-12-01 LAB — BAYER DCA HB A1C WAIVED: HB A1C (BAYER DCA - WAIVED): 8.1 % — ABNORMAL HIGH (ref 4.8–5.6)

## 2023-12-01 MED ORDER — LORATADINE 10 MG PO TABS: 10.0000 mg | ORAL_TABLET | Freq: Every day | ORAL | 0 refills | Status: AC

## 2023-12-01 MED ORDER — METFORMIN HCL ER (MOD) 1000 MG PO TB24: 1000.0000 mg | ORAL_TABLET | Freq: Every day | ORAL | 0 refills | Status: DC

## 2023-12-01 MED ORDER — FENOFIBRATE 48 MG PO TABS: 48.0000 mg | ORAL_TABLET | Freq: Every day | ORAL | 0 refills | Status: DC

## 2023-12-01 MED ORDER — AMLODIPINE BESYLATE 10 MG PO TABS: 10.0000 mg | ORAL_TABLET | Freq: Every day | ORAL | 0 refills | Status: DC

## 2023-12-01 MED ORDER — ACCU-CHEK SOFTCLIX LANCET DEV KIT: 1.0000 | PACK | Freq: Two times a day (BID) | 0 refills | Status: DC

## 2023-12-01 NOTE — Progress Notes (Signed)
 Established Patient Office Visit  Subjective  Patient ID: Stephen Lucero, male    DOB: 04-12-1966  Age: 58 y.o. MRN: [id number]  Chief Complaint  Patient presents with   Medical Management of Chronic Issues    3 month     HPI Stephen Lucero 58 year old male present Dec 01, 2023 for 3 months follow-up for chronic disease management, no other concerns Hypertension, follow-up  BP Readings from Last 3 Encounters:  12/01/23 (!) 149/88  10/28/23 (!) 164/91  10/28/23 (!) 160/90   Wt Readings from Last 3 Encounters:  12/01/23 194 lb 3.2 oz (88.1 kg)  10/28/23 190 lb (86.2 kg)  10/28/23 190 lb 6.4 oz (86.4 kg)     He was last seen for hypertension 3 months ago.  BP at that visit was 164/91. Management since that visit includes Zestoretic  20/25 milligram daily and amlodipine  5 mg daily.  He reports excellent compliance with treatment. He is not having side effects.  He is following a Regular diet. He is not exercising. He does not smoke.  Use of agents associated with hypertension: none.   Outside blood pressures are not being monitored. Symptoms: No chest pain No chest pressure  No palpitations No syncope  No dyspnea No orthopnea  No paroxysmal nocturnal dyspnea No lower extremity edema   Pertinent labs Lab Results  Component Value Date   CHOL 266 (H) 08/25/2023   HDL 43 08/25/2023   LDLCALC 172 (H) 08/25/2023   LDLDIRECT 167.0 10/03/2017   TRIG 271 (H) 08/25/2023   CHOLHDL 6.2 (H) 08/25/2023   Lab Results  Component Value Date   NA 132 (L) 10/28/2023   K 4.0 10/28/2023   CREATININE 0.70 10/28/2023   GFRNONAA >60 10/28/2023   GLUCOSE 180 (H) 10/28/2023   TSH 1.190 08/25/2023     The 10-year ASCVD risk score (Arnett DK, et al., 2019) is: 32.5%  Diabetes Mellitus Type II, Follow-up  Lab Results  Component Value Date   HGBA1C 8.1 (H) 12/01/2023   HGBA1C 11.3 (H) 08/25/2023   HGBA1C 8.2 (A) 05/29/2020   HGBA1C 8.2 05/29/2020   HGBA1C 8.2 (A) 05/29/2020    HGBA1C 8.2 (A) 05/29/2020   Wt Readings from Last 3 Encounters:  12/01/23 194 lb 3.2 oz (88.1 kg)  10/28/23 190 lb (86.2 kg)  10/28/23 190 lb 6.4 oz (86.4 kg)   Last seen for diabetes 3 months ago.  Management since then includes Ozempic  0.25 mg every 7 days, metformin  1000 mg twice daily He reports poor compliance with treatment. He is having side effects. Diarrhea/, N/V with metformin  stopped taking it and has not started Ozempic   "I do not like needles I wanted to wait to make sure that I really needed A1c today 8.1% symptoms: No fatigue No foot ulcerations  No appetite changes Yes nausea  No paresthesia of the feet  No polydipsia  No polyuria No visual disturbances   Yes vomiting     Home blood sugar records: Not being monitored  Episodes of hypoglycemia? Yes 2   Current insulin regiment: Not on insulin  most Recent Eye Exam: Need to schedule diabetic retinal eye exam. Current exercise: none Current diet habits: in general, an "unhealthy" diet  Pertinent Labs: Lab Results  Component Value Date   CHOL 266 (H) 08/25/2023   HDL 43 08/25/2023   LDLCALC 172 (H) 08/25/2023   LDLDIRECT 167.0 10/03/2017   TRIG 271 (H) 08/25/2023   CHOLHDL 6.2 (H) 08/25/2023   Lab Results  Component Value Date  NA 132 (L) 10/28/2023   K 4.0 10/28/2023   CREATININE 0.70 10/28/2023   GFRNONAA >60 10/28/2023   MICRALBCREAT 47 (H) 08/25/2023     Lipid/Cholesterol, Follow-up  Last lipid panel Other pertinent labs  Lab Results  Component Value Date   CHOL 266 (H) 08/25/2023   HDL 43 08/25/2023   LDLCALC 172 (H) 08/25/2023   LDLDIRECT 167.0 10/03/2017   TRIG 271 (H) 08/25/2023   CHOLHDL 6.2 (H) 08/25/2023   Lab Results  Component Value Date   ALT 29 10/28/2023   AST 20 10/28/2023   PLT 254 10/28/2023   TSH 1.190 08/25/2023     He was last seen for this 3 months ago.  Management since that visit includes Tricor  48 mg daily.  He reports poor compliance with treatment.  Only take  it for few days "I do not like taking medication" He is not having side effects.   Symptoms: No chest pain No chest pressure/discomfort  No dyspnea No lower extremity edema  No numbness or tingling of extremity No orthopnea  No palpitations No paroxysmal nocturnal dyspnea  No speech difficulty No syncope   Current diet: in general, an "unhealthy" diet Current exercise: none  The 10-year ASCVD risk score (Arnett DK, et al., 2019) is: 32.5%  Allergic Rhinitis: Stephen Lucero is here for evaluation of possible allergic rhinitis. Patient's symptoms include nasal congestion. These symptoms are perennial with seasonal exacerbation. Current triggers include exposure to no known precipitant.  Currently being managed with Claritin  10 mg daily. the patient has tried prescription antihistamines with excellent relief of symptoms. Immunotherapy has never been tried. The patient has never had nasal polyps. The patient has no history of asthma. The patient does not suffer from frequent sinopulmonary infections. The patient has not had sinus surgery in the past. The patient has no history of eczema.   Patient Active Problem List   Diagnosis Date Noted   Allergic rhinitis 12/01/2023   Tachycardia 10/28/2023   Encounter for general adult medical examination with abnormal findings 08/25/2023   Chest pain on exertion 08/25/2023   diabetes type 2 09/14/2019   Noncompliance with diabetes treatment 10/21/2018   Noncompliance 10/21/2018   CAD in native artery 08/16/2016   Hyperlipidemia 04/25/2016   Heart palpitations 08/22/2015   Essential hypertension, benign 08/22/2015   Past Medical History:  Diagnosis Date   Allergy    Diabetes mellitus without complication (HCC)    GERD (gastroesophageal reflux disease)    High cholesterol    Hypercalcemia 10/06/2017   Hypertension    Past Surgical History:  Procedure Laterality Date   CARDIAC CATHETERIZATION     Social History   Tobacco Use   Smoking  status: Never   Smokeless tobacco: Never  Vaping Use   Vaping status: Never Used  Substance Use Topics   Alcohol use: Yes    Alcohol/week: 12.0 standard drinks of alcohol    Types: 12 Cans of beer per week    Comment: Occasionaly on weekends   Drug use: No   Social History   Socioeconomic History   Marital status: Single    Spouse name: Not on file   Number of children: Not on file   Years of education: Not on file   Highest education level: Not on file  Occupational History   Occupation: restore [employer]  Tobacco Use   Smoking status: Never   Smokeless tobacco: Never  Vaping Use   Vaping status: Never Used  Substance and Sexual Activity  Alcohol use: Yes    Alcohol/week: 12.0 standard drinks of alcohol    Types: 12 Cans of beer per week    Comment: Occasionaly on weekends   Drug use: No   Sexual activity: Yes    Partners: Female  Other Topics Concern   Not on file  Social History Narrative   Single. No children.   11th grade education.    Wears seatbelt   Regular diet.    Smoke detector in the home.    Feels safe in relationship.    No firearms in the home.    Social Drivers of Corporate investment banker Strain: Low Risk  (08/25/2023)   Overall Financial Resource Strain (CARDIA)    Difficulty of Paying Living Expenses: Not very hard  Food Insecurity: No Food Insecurity (08/25/2023)   Hunger Vital Sign    Worried About Running Out of Food in the Last Year: Never true    Ran Out of Food in the Last Year: Never true  Transportation Needs: No Transportation Needs (08/25/2023)   PRAPARE - Administrator, Civil Service (Medical): No    Lack of Transportation (Non-Medical): No  Physical Activity: Unknown (08/25/2023)   Exercise Vital Sign    Days of Exercise per Week: 7 days    Minutes of Exercise per Session: Patient unable to answer  Stress: Stress Concern Present (08/25/2023)   [employer] of Occupational Health - Occupational Stress  Questionnaire    Feeling of Stress : Very much  Social Connections: Socially Isolated (08/25/2023)   Social Connection and Isolation Panel [NHANES]    Frequency of Communication with Friends and Family: More than three times a week    Frequency of Social Gatherings with Friends and Family: More than three times a week    Attends Religious Services: Never    Database administrator or Organizations: No    Attends Banker Meetings: Never    Marital Status: Never married  Intimate Partner Violence: Not At Risk (08/25/2023)   Humiliation, Afraid, Rape, and Kick questionnaire    Fear of Current or Ex-Partner: No    Emotionally Abused: No    Physically Abused: No    Sexually Abused: No   Family Status  Relation Name Status   Mother  Alive   Father  Deceased   Sister  Deceased   Mat Aunt  (Not Specified)   PGF  (Not Specified)  No partnership data on file   Family History  Problem Relation Age of Onset   Hypertension Mother    Alcohol abuse Father    Cirrhosis Father    Early death Father 58       cirrhosis    Cancer Maternal Aunt    Heart disease Paternal Grandfather    Hyperlipidemia Paternal Grandfather    Hypertension Paternal Grandfather    Allergies  Allergen Reactions   Lipitor [Atorvastatin ] Other (See Comments)    mylagia   Shellfish-Derived Products Other (See Comments)    oysters      Review of Systems  Constitutional:  Negative for chills and fever.  HENT:  Negative for ear pain and sore throat.   Respiratory:  Negative for cough and wheezing.   Cardiovascular:  Negative for chest pain and leg swelling.  Gastrointestinal:  Positive for diarrhea, nausea and vomiting.  Musculoskeletal:  Negative for falls.  Skin:  Negative for itching and rash.  Neurological:  Negative for dizziness and headaches.  Endo/Heme/Allergies:  Positive for  environmental allergies.  Psychiatric/Behavioral:  Negative for suicidal ideas. The patient does not have insomnia.     Negative unless indicated in HPI   Objective:     BP (!) 149/88   Pulse 95   Temp (!) 97.5 F (36.4 C) (Temporal)   Ht 5\' 8"  (1.727 m)   Wt 194 lb 3.2 oz (88.1 kg)   SpO2 98%   BMI 29.53 kg/m  BP Readings from Last 3 Encounters:  12/01/23 (!) 149/88  10/28/23 (!) 164/91  10/28/23 (!) 160/90   Wt Readings from Last 3 Encounters:  12/01/23 194 lb 3.2 oz (88.1 kg)  10/28/23 190 lb (86.2 kg)  10/28/23 190 lb 6.4 oz (86.4 kg)      Physical Exam Vitals and nursing note reviewed.  Constitutional:      General: He is not in acute distress. HENT:     Head: Normocephalic and atraumatic.     Nose: Nose normal.  Eyes:     General: No scleral icterus.    Extraocular Movements: Extraocular movements intact.     Conjunctiva/sclera: Conjunctivae normal.     Pupils: Pupils are equal, round, and reactive to light.  Cardiovascular:     Heart sounds: Normal heart sounds.  Pulmonary:     Effort: Pulmonary effort is normal.     Breath sounds: Normal breath sounds.  Abdominal:     General: Bowel sounds are normal.     Palpations: Abdomen is soft.  Musculoskeletal:        General: Normal range of motion.     Right lower leg: No edema.     Left lower leg: No edema.  Skin:    General: Skin is warm and dry.     Findings: No rash.  Neurological:     Mental Status: He is alert and oriented to person, place, and time.      Results for orders placed or performed in visit on 12/01/23  Bayer DCA Hb A1c Waived  Result Value Ref Range   HB A1C (BAYER DCA - WAIVED) 8.1 (H) 4.8 - 5.6 %    Last CBC Lab Results  Component Value Date   WBC 7.4 10/28/2023   HGB 16.0 10/28/2023   HCT 45.6 10/28/2023   MCV 85.9 10/28/2023   MCH 30.1 10/28/2023   RDW 12.1 10/28/2023   PLT 254 10/28/2023   Last metabolic panel Lab Results  Component Value Date   GLUCOSE 180 (H) 10/28/2023   NA 132 (L) 10/28/2023   K 4.0 10/28/2023   CL 95 (L) 10/28/2023   CO2 25 10/28/2023   BUN 14  10/28/2023   CREATININE 0.70 10/28/2023   GFRNONAA >60 10/28/2023   CALCIUM  10.1 10/28/2023   PROT 8.4 (H) 10/28/2023   ALBUMIN 4.8 10/28/2023   LABGLOB 2.7 08/25/2023   BILITOT 0.6 10/28/2023   ALKPHOS 56 10/28/2023   AST 20 10/28/2023   ALT 29 10/28/2023   ANIONGAP 12 10/28/2023   Last lipids Lab Results  Component Value Date   CHOL 266 (H) 08/25/2023   HDL 43 08/25/2023   LDLCALC 172 (H) 08/25/2023   LDLDIRECT 167.0 10/03/2017   TRIG 271 (H) 08/25/2023   CHOLHDL 6.2 (H) 08/25/2023   Last hemoglobin A1c Lab Results  Component Value Date   HGBA1C 8.1 (H) 12/01/2023   Last thyroid  functions Lab Results  Component Value Date   TSH 1.190 08/25/2023   T4TOTAL 7.8 08/25/2023        Assessment & Plan:  Essential  hypertension, benign -     amLODIPine  Besylate; Take 1 tablet (10 mg total) by mouth daily.  Dispense: 90 tablet; Refill: 0  diabetes type 2 -     Bayer DCA Hb A1c Waived -     Accu-Chek Softclix Lancet Dev; 1 each by Does not apply route in the morning and at bedtime.  Dispense: 1 kit; Refill: 0  Hyperlipidemia, unspecified hyperlipidemia type -     Fenofibrate ; Take 1 tablet (48 mg total) by mouth daily.  Dispense: 90 tablet; Refill: 0  Allergic rhinitis, unspecified seasonality, unspecified trigger  Other orders -     Loratadine ; Take 1 tablet (10 mg total) by mouth daily.  Dispense: 90 tablet; Refill: 0 -     metFORMIN  HCl ER (MOD); Take 1 tablet (1,000 mg total) by mouth daily with breakfast.  Dispense: 90 tablet; Refill: 0   Stephen Lucero is a 51 year old Caucasian male seen today for chronic disease management, no acute distress  Hypertension: BP not at goal, plan to increase amlodipine  to 10 mg daily and continue Zestoretic  20-25 mg daily  Hyperlipidemia: Client to start taking fenofibrate  48 mg daily  Allergic rhinitis: Continue Claritin  10 mg daily refill provided  Diabetes: DC metformin  and start metformin  extended release 1000 mg daily and  client to start taking Ozempic  0.25 mg every 7 days, client to monitor blood glucose twice a day, supplies ordered, He is instructed to make appointment for diabetic exam and retinal exam at checkout Future: If BP is not at goal plan to refer to hypertension clinic  Continue healthy lifestyle choices, including diet (rich in fruits, vegetables, and lean proteins, and low in salt and simple carbohydrates) and exercise (at least 30 minutes of moderate physical activity daily).     The above assessment and management plan was discussed with the patient. The patient verbalized understanding of and has agreed to the management plan. Patient is aware to call the clinic if they develop any new symptoms or if symptoms persist or worsen. Patient is aware when to return to the clinic for a follow-up visit. Patient educated on when it is appropriate to go to the emergency department.  Return in about 3 months (around 03/02/2024) for Chronic Diseases Management.    Kadia Abaya [place] Thompson, DNP [facility] [address] [place], [place] [address] [phone]    Note: This document was prepared by [employer] voice dictation technology and any errors that results from this process are unintentional.

## 2023-12-02 ENCOUNTER — Telehealth: Payer: Self-pay | Admitting: Nurse Practitioner

## 2023-12-02 ENCOUNTER — Other Ambulatory Visit: Payer: Self-pay | Admitting: Nurse Practitioner

## 2023-12-02 DIAGNOSIS — E119 Type 2 diabetes mellitus without complications: Secondary | ICD-10-CM

## 2023-12-02 MED ORDER — ACCU-CHEK GUIDE ME W/DEVICE KIT: PACK | 0 refills | Status: AC

## 2023-12-02 MED ORDER — ACCU-CHEK GUIDE TEST VI STRP: ORAL_STRIP | 3 refills | Status: AC

## 2023-12-02 MED ORDER — ACCU-CHEK FASTCLIX LANCETS MISC: 3 refills | Status: AC

## 2023-12-02 NOTE — Telephone Encounter (Signed)
 metFORMIN  (GLUCOPHAGE -XR) 500 MG 24 hr tablet        Changed from: metFORMIN  (GLUMETZA ) 1000 MG (MOD) 24 hr tablet   Pharmacy comment: Alternative Requested:DRUG NOT ON FORMULARY. PLEASE SEND PRIOR AUTH FOR THE GLUMETZA  OR A COVERED ALTERNATIVE.

## 2023-12-03 ENCOUNTER — Other Ambulatory Visit: Payer: Self-pay | Admitting: Nurse Practitioner

## 2023-12-03 ENCOUNTER — Other Ambulatory Visit (HOSPITAL_COMMUNITY): Payer: Self-pay

## 2023-12-03 DIAGNOSIS — E119 Type 2 diabetes mellitus without complications: Secondary | ICD-10-CM

## 2023-12-03 MED ORDER — METFORMIN HCL ER 500 MG PO TB24: ORAL_TABLET | ORAL | 0 refills | Status: DC

## 2023-12-03 NOTE — Telephone Encounter (Signed)
 TC back from pt stating that the Metform is giving him Diarrhea/, N/V every morning and this was the reason for the change to the Glumetza  Please advise on alternative since the Glumetza  is not covered.

## 2023-12-03 NOTE — Telephone Encounter (Signed)
 NA/NVM per PCP the XR Metformin  does not have the GI symptoms. The med that was sent for him is an XR.metFORMIN  (GLUCOPHAGE -XR) 500 MG 24 hr table

## 2023-12-03 NOTE — Telephone Encounter (Signed)
 NA/NVM PCP changed from Glumetza  to metformin .

## 2023-12-15 ENCOUNTER — Ambulatory Visit

## 2023-12-15 NOTE — Progress Notes (Unsigned)
 Cardiology Office Note   Date:  12/17/2023   ID:  Stephen Lucero, DOB 1966/05/04, MRN [id number]  PCP:  Anton Baton, NP  Cardiologist:   Eilleen Grates, MD Referring:  Anton Baton, NP  Chief Complaint  Patient presents with   Coronary Artery Disease      History of Present Illness: Stephen Lucero is a 58 y.o. male who presents for evaluation of shortness of breath and chest discomfort.  He was referred by Anton Baton, NP.  Although he does not recall this I was able to find a cardiac catheterization from 2012 at another hospital demonstrated an EF of 60%, the LAD had 50% stenosis, diagonal 50% stenosis.  There were no obstructive lesions.  I was able to find a negative stress echo in 2020.  He presents because of some chest discomfort.  He said has had this for about 10 years.  That happens when he is lifting something or climbing a flight of stairs.  He thinks it has increased somewhat in intensity over time.  He is not describing resting symptoms.  He describes discomfort of his left of his sternum.  Somewhat sharp but can be heavy.  He can be 5 out of 10 in intensity.  He feels lightheaded with it.  He might have shortness of breath.  He is not describing any PND or orthopnea.  He is not describing palpitations, presyncope or syncope.  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   Past Medical History:  Diagnosis Date   Allergy    Diabetes mellitus without complication (HCC)    GERD (gastroesophageal reflux disease)    High cholesterol    Hypercalcemia 10/06/2017   Hypertension     Past Surgical History:  Procedure Laterality Date   CARDIAC CATHETERIZATION       Current Outpatient Medications  Medication Sig Dispense Refill   amLODipine  (NORVASC ) 10 MG tablet Take 1 tablet (10 mg total) by mouth daily. 90 tablet 0   aspirin  EC 81 MG tablet Take 1 tablet (81 mg total) by mouth daily. Swallow whole.     erythromycin  ophthalmic ointment SMARTSIG:1 Inch(es) In Eye(s) Every Night     famotidine  (PEPCID ) 20 MG tablet Take by mouth.     fenofibrate  (TRICOR ) 48 MG tablet Take 1 tablet (48 mg total) by mouth daily. 90 tablet 0   hydrochlorothiazide  (HYDRODIURIL ) 25 MG tablet Take 1 tablet (25 mg total) by mouth daily. 90 tablet 3   lisinopril  (ZESTRIL ) 40 MG tablet Take 1 tablet (40 mg total) by mouth daily. 90 tablet 3   loratadine  (CLARITIN ) 10 MG tablet Take 1 tablet (10 mg total) by mouth daily. 90 tablet 0   metFORMIN  (GLUCOPHAGE -XR) 500 MG 24 hr tablet Take 500 mg by mouth once daily x1 week, then increase to 500 mg twice daily x1 week, then 1,000 mg in the AM and 500 mg in the PM x1 week, and finally 1,000 mg twice daily. Take with food. 90 tablet 0   nitroGLYCERIN  (NITROSTAT ) 0.4 MG SL tablet Place 1 tablet (0.4 mg total) under the tongue every 5 (five) minutes as needed for chest pain. Reported on 11/27/2015 10 tablet 0   rosuvastatin (CRESTOR) 20 MG tablet Take 1 tablet (20 mg total) by mouth daily. 90 tablet 3   Semaglutide ,0.25 or 0.5MG /DOS, 2 MG/3ML SOPN Inject 0.5 mg into the skin every 7 (seven) days. 3 mL 2   tobramycin (TOBREX) 0.3 %  ophthalmic solution Place 1 drop into the left eye 4 (four) times daily.     Accu-Chek FastClix Lancets MISC Check BS in the morning and at bedtime Dx E11.9 200 each 3   Blood Glucose Monitoring Suppl (ACCU-CHEK GUIDE ME) w/Device KIT Check BS in the morning and at bedtime Dx E11.9 1 kit 0   EPINEPHrine  0.3 mg/0.3 mL IJ SOAJ injection Inject 0.3 mg into the muscle as needed for anaphylaxis. 1 each 1   glucose blood (ACCU-CHEK GUIDE TEST) test strip Check BS in the morning and at bedtime Dx E11.9 200 each 3   Lancets Misc. (ACCU-CHEK SOFTCLIX LANCET DEV) KIT Check BS in the morning and at bedtime Dx E11.9 1 kit 0   No current facility-administered medications for this visit.    Allergies:   Lipitor Stephen.Lucero ] and Shellfish-derived products    Social History:  The  patient  reports that he has never smoked. He has never used smokeless tobacco. He reports current alcohol use of about 12.0 standard drinks of alcohol per week. He reports that he does not use drugs. He lives with his girlfriend.  Family History:  The patient's family history includes Alcohol abuse in his father; Cancer in his maternal aunt and sister; Cirrhosis in his father; Early death (age of onset: 11) in his father; Heart disease in his paternal grandfather; Hyperlipidemia in his paternal grandfather; Hypertension in his mother and paternal grandfather.      ROS:  Please see the history of present illness.   Otherwise, review of systems are positive for none.   All other systems are reviewed and negative.    PHYSICAL EXAM: VS:  BP (!) 178/98   Pulse (!) 108   Ht 5\' 6"  (1.676 m)   Wt 195 lb (88.5 kg)   BMI 31.47 kg/m  , BMI Body mass index is 31.47 kg/m. GENERAL:  Well appearing HEENT:  Pupils equal round and reactive, fundi not visualized, oral mucosa unremarkable NECK:  No jugular venous distention, waveform within normal limits, carotid upstroke brisk and symmetric, no bruits, no thyromegaly LYMPHATICS:  No cervical, inguinal adenopathy LUNGS:  Clear to auscultation bilaterally BACK:  No CVA tenderness CHEST:  Unremarkable HEART:  PMI not displaced or sustained,S1 and S2 within normal limits, no S3, no S4, no clicks, no rubs, 3 out of 6 apical systolic murmur radiating slightly at the aortic outflow tract and increasing slightly strain phase of Valsalva, no diastolic murmurs ABD:  Flat, positive bowel sounds normal in frequency in pitch, no bruits, no rebound, no guarding, no midline pulsatile mass, no hepatomegaly, no splenomegaly EXT:  2 plus pulses throughout, no edema, no cyanosis no clubbing SKIN:  No rashes no nodules NEURO:  Cranial nerves II through XII grossly intact, motor grossly intact throughout PSYCH:  Cognitively intact, oriented to person place and time   EKG:     EKG 10/28/2023 sinus rhythm, possible old inferior infarct, lateral T wave inversions 1 and aVL.   Recent Labs: 08/25/2023: TSH 1.190 10/28/2023: ALT 29; BUN 14; Creatinine, Ser 0.70; Hemoglobin 16.0; Magnesium 1.9; Platelets 254; Potassium 4.0; Sodium 132    Lipid Panel    Component Value Date/Time   CHOL 266 (H) 08/25/2023 0917   TRIG 271 (H) 08/25/2023 0917   HDL 43 08/25/2023 0917   CHOLHDL 6.2 (H) 08/25/2023 0917   CHOLHDL 5 04/24/2016 0946   VLDL 17.2 04/24/2016 0946   LDLCALC 172 (H) 08/25/2023 0917   LDLDIRECT 167.0 10/03/2017 1014  Wt Readings from Last 3 Encounters:  12/17/23 195 lb (88.5 kg)  12/01/23 194 lb 3.2 oz (88.1 kg)  10/28/23 190 lb (86.2 kg)      Other studies Reviewed: Additional studies/ records that were reviewed today include: Outside records as above, primary care records. Review of the above records demonstrates:  Please see elsewhere in the note.     ASSESSMENT AND PLAN:  Murmur:I am going to start with an echocardiogram.  I will determine further testing following this as described below.  Coronary artery disease: He has known coronary disease so he was not really aware.  He needs aggressive risk reduction.  He is going to need ischemia workup but I am going to wait until after the echo to determine what kind of testing I want.  Dyslipidemia: LDL was 172.  Start Crestor 20 mg daily in 3 months check a lipid and LP(a).  We talked about diet.  Diabetes mellitus: His A1c was 8.1.  We talked about target.  He was just started on semaglutide .  Hypertension: The blood pressure is significantly elevated.  HTN: Blood pressure is not at target.  Increase his lisinopril  to 40/25   Current medicines are reviewed at length with the patient today.  The patient does not have concerns regarding medicines.  The following changes have been made: As above  Labs/ tests ordered today include:   Orders Placed This Encounter  Procedures   Lipoprotein A  (LPA)   Lipid panel   ECHOCARDIOGRAM COMPLETE     Disposition:   FU with me after the echo   Signed, Eilleen Grates, MD  12/17/2023 2:52 PM    [facility]

## 2023-12-17 ENCOUNTER — Encounter: Payer: Self-pay | Admitting: Cardiology

## 2023-12-17 ENCOUNTER — Ambulatory Visit (INDEPENDENT_AMBULATORY_CARE_PROVIDER_SITE_OTHER): Payer: Self-pay | Admitting: Cardiology

## 2023-12-17 VITALS — BP 178/98 | HR 108 | Ht 66.0 in | Wt 195.0 lb

## 2023-12-17 DIAGNOSIS — I10 Essential (primary) hypertension: Secondary | ICD-10-CM

## 2023-12-17 DIAGNOSIS — R011 Cardiac murmur, unspecified: Secondary | ICD-10-CM

## 2023-12-17 DIAGNOSIS — E7849 Other hyperlipidemia: Secondary | ICD-10-CM | POA: Diagnosis not present

## 2023-12-17 MED ORDER — ROSUVASTATIN CALCIUM 20 MG PO TABS: 20.0000 mg | ORAL_TABLET | Freq: Every day | ORAL | 3 refills | Status: AC

## 2023-12-17 MED ORDER — HYDROCHLOROTHIAZIDE 25 MG PO TABS: 25.0000 mg | ORAL_TABLET | Freq: Every day | ORAL | 3 refills | Status: DC

## 2023-12-17 MED ORDER — LISINOPRIL 40 MG PO TABS: 40.0000 mg | ORAL_TABLET | Freq: Every day | ORAL | 3 refills | Status: DC

## 2023-12-17 NOTE — Patient Instructions (Addendum)
 Medication Instructions:   Start Crestor 20 mg Daily at supper.  Increase Lisinopril  to 40 mg Daily  Take Hydrochlorothiazide  25 mg daily   *If you need a refill on your cardiac medications before your next appointment, please call your pharmacy*  Lab Work: Your physician recommends that you return for lab work in: 3 Months ( LpA, Lipid)   If you have labs (blood work) drawn today and your tests are completely normal, you will receive your results only by: MyChart Message (if you have MyChart) OR A paper copy in the mail If you have any lab test that is abnormal or we need to change your treatment, we will call you to review the results.  Testing/Procedures: Your physician has requested that you have an echocardiogram. Echocardiography is a painless test that uses sound waves to create images of your heart. It provides your doctor with information about the size and shape of your heart and how well your heart's chambers and valves are working. This procedure takes approximately one hour. There are no restrictions for this procedure. Please do NOT wear cologne, perfume, aftershave, or lotions (deodorant is allowed). Please arrive 15 minutes prior to your appointment time.  Please note: We ask at that you not bring children with you during ultrasound (echo/ vascular) testing. Due to room size and safety concerns, children are not allowed in the ultrasound rooms during exams. Our front office staff cannot provide observation of children in our lobby area while testing is being conducted. An adult accompanying a patient to their appointment will only be allowed in the ultrasound room at the discretion of the ultrasound technician under special circumstances. We apologize for any inconvenience.   Follow-Up: At [facility], you and your health needs are our priority.  As part of our continuing mission to provide you with exceptional heart care, our providers are all part of one team.   This team includes your primary Cardiologist (physician) and Advanced Practice Providers or APPs (Physician Assistants and Nurse Practitioners) who all work together to provide you with the care you need, when you need it.  Your next appointment:    After your Echo   Provider:   Eilleen Grates, MD    We recommend signing up for the patient portal called "MyChart".  Sign up information is provided on this After Visit Summary.  MyChart is used to connect with patients for Virtual Visits (Telemedicine).  Patients are able to view lab/test results, encounter notes, upcoming appointments, etc.  Non-urgent messages can be sent to your provider as well.   To learn more about what you can do with MyChart, go to [email or web address].   Other Instructions Thank you for choosing  [facility]!

## 2024-01-06 ENCOUNTER — Other Ambulatory Visit: Payer: PRIVATE HEALTH INSURANCE

## 2024-01-06 ENCOUNTER — Other Ambulatory Visit: Payer: Self-pay | Admitting: Nurse Practitioner

## 2024-01-06 DIAGNOSIS — E119 Type 2 diabetes mellitus without complications: Secondary | ICD-10-CM

## 2024-01-07 ENCOUNTER — Other Ambulatory Visit: Payer: Self-pay | Admitting: Nurse Practitioner

## 2024-01-07 MED ORDER — METFORMIN HCL ER 500 MG PO TB24: 1000.0000 mg | ORAL_TABLET | Freq: Two times a day (BID) | ORAL | 0 refills | Status: AC

## 2024-01-21 ENCOUNTER — Ambulatory Visit (HOSPITAL_COMMUNITY): Admission: RE | Admit: 2024-01-21 | Payer: Self-pay | Source: Ambulatory Visit

## 2024-02-26 ENCOUNTER — Other Ambulatory Visit: Payer: Self-pay | Admitting: Nurse Practitioner

## 2024-02-26 DIAGNOSIS — I10 Essential (primary) hypertension: Secondary | ICD-10-CM

## 2024-02-26 DIAGNOSIS — E785 Hyperlipidemia, unspecified: Secondary | ICD-10-CM

## 2024-03-03 ENCOUNTER — Ambulatory Visit: Admitting: Nurse Practitioner

## 2024-03-04 NOTE — Progress Notes (Signed)
 Established Patient Office Visit  Subjective  Patient ID: Stephen Lucero, male    DOB: 1965/07/28  Age: 58 y.o. MRN: [id number]  Chief Complaint  Patient presents with   Medical Management of Chronic Issues    3 month - would like something to help with anxiety     HPI Stephen Lucero 58 year old male present for 3 months follow-up for chronic disease management.  Need nerve pill d/t caring fro mother and girlfriend new dx  Diabetes Mellitus Type II, Follow-up  Lab Results  Component Value Date   HGBA1C 10.7 (H) 03/08/2024   HGBA1C 8.1 (H) 12/01/2023   HGBA1C 11.3 (H) 08/25/2023   Wt Readings from Last 3 Encounters:  03/08/24 191 lb (86.6 kg)  12/17/23 195 lb (88.5 kg)  12/01/23 194 lb 3.2 oz (88.1 kg)   Last seen for diabetes 3 months ago.  Management since then includes ozmepic and Metformin . He reports good compliance with treatment. He is not having side effects.  Symptoms:  fatigue No foot ulcerations  No appetite changes No nausea  No paresthesia of the feet  No polydipsia  No polyuria No visual disturbances   No vomiting     Home blood sugar records: fasting range: 160  Episodes of hypoglycemia? No    Current insulin regiment: none Most Recent Eye Exam: 08/25/2023 Current exercise: none Current diet habits: on average, 2 meals per day  Pertinent Labs: Lab Results  Component Value Date   CHOL 266 (H) 08/25/2023   HDL 43 08/25/2023   LDLCALC 172 (H) 08/25/2023   LDLDIRECT 167.0 10/03/2017   TRIG 271 (H) 08/25/2023   CHOLHDL 6.2 (H) 08/25/2023   Lab Results  Component Value Date   NA 132 (L) 10/28/2023   K 4.0 10/28/2023   CREATININE 0.70 10/28/2023   GFRNONAA >60 10/28/2023   MICRALBCREAT 47 (H) 08/25/2023     Hypertension, follow-up  BP Readings from Last 3 Encounters:  03/08/24 (!) 156/80  12/17/23 (!) 178/98  12/01/23 (!) 149/88   Wt Readings from Last 3 Encounters:  03/08/24 191 lb (86.6 kg)  12/17/23 195 lb (88.5 kg)  12/01/23 194  lb 3.2 oz (88.1 kg)     He was last seen for hypertension 3 months ago.  BP at that visit was 178/98. Management since that visit includes lisinopril  40 mg , amlodipine  10 mg and HCTZ 12.5 mg.  He reports excellent compliance with treatment. He is not having side effects.  He is following a Regular diet. He is not exercising. Use of agents associated with hypertension: .   Outside blood pressures are not being monitored symptoms: No chest pain No chest pressure  No palpitations No syncope  No dyspnea No orthopnea  No paroxysmal nocturnal dyspnea No lower extremity edema   Pertinent labs Lab Results  Component Value Date   CHOL 266 (H) 08/25/2023   HDL 43 08/25/2023   LDLCALC 172 (H) 08/25/2023   LDLDIRECT 167.0 10/03/2017   TRIG 271 (H) 08/25/2023   CHOLHDL 6.2 (H) 08/25/2023   Lab Results  Component Value Date   NA 132 (L) 10/28/2023   K 4.0 10/28/2023   CREATININE 0.70 10/28/2023   GFRNONAA >60 10/28/2023   GLUCOSE 180 (H) 10/28/2023   TSH 1.190 08/25/2023     The 10-year ASCVD risk score (Arnett DK, et al., 2019) is: 34.8%  Lipid/Cholesterol, Follow-up  Last lipid panel Other pertinent labs  Lab Results  Component Value Date   CHOL 266 (H) 08/25/2023  HDL 43 08/25/2023   LDLCALC 172 (H) 08/25/2023   LDLDIRECT 167.0 10/03/2017   TRIG 271 (H) 08/25/2023   CHOLHDL 6.2 (H) 08/25/2023   Lab Results  Component Value Date   ALT 29 10/28/2023   AST 20 10/28/2023   PLT 254 10/28/2023   TSH 1.190 08/25/2023     He was last seen for this 3 months ago.  Management since that visit includes Crestor  20 mg daily.  He reports excellent compliance with treatment. He is not having side effects.   Symptoms: No chest pain No chest pressure/discomfort  No dyspnea No lower extremity edema  No numbness or tingling of extremity No orthopnea  No palpitations No paroxysmal nocturnal dyspnea  No speech difficulty No syncope   Current diet: in general, an unhealthy  diet Current exercise: none  The 10-year ASCVD risk score (Arnett DK, et al., 2019) is: 34.8%  Anxiety Stephen Lucero is requesting benzodiazepine for his anxiety due to increased anxiety with caring for his mother and his girlfriend/fianc no diagnosis.  Below is Stephen Lucero screening for anxiety and depression.  Discussed with him possible referral for counseling with has divided his intake medication appropriate right now.  He declines.  Denies SI/HI     03/08/2024   12:29 PM 08/25/2023    9:07 AM 05/29/2020   10:22 AM  PHQ9 SCORE ONLY  PHQ-9 Total Score 3 0  0      Data saved with a previous flowsheet row definition       03/08/2024   12:29 PM  GAD 7 : Generalized Anxiety Score  Nervous, Anxious, on Edge 2  Control/stop worrying 2  Worry too much - different things 1  Trouble relaxing 0  Restless 0  Easily annoyed or irritable 0  Afraid - awful might happen 0  Total GAD 7 Score 5  Anxiety Difficulty Not difficult at all     Patient Active Problem List   Diagnosis Date Noted   Anxiety and depression 03/08/2024   Allergic rhinitis 12/01/2023   Tachycardia 10/28/2023   Encounter for general adult medical examination with abnormal findings 08/25/2023   Chest pain on exertion 08/25/2023   diabetes type 2 09/14/2019   Noncompliance with diabetes treatment 10/21/2018   Noncompliance 10/21/2018   CAD in native artery 08/16/2016   Hyperlipidemia 04/25/2016   Heart palpitations 08/22/2015   Essential hypertension, benign 08/22/2015   Past Medical History:  Diagnosis Date   Allergy    Diabetes mellitus without complication (HCC)    GERD (gastroesophageal reflux disease)    High cholesterol    Hypercalcemia 10/06/2017   Hypertension    Past Surgical History:  Procedure Laterality Date   CARDIAC CATHETERIZATION     Social History   Tobacco Use   Smoking status: Never   Smokeless tobacco: Never  Vaping Use   Vaping status: Never Used  Substance Use Topics   Alcohol use:  Yes    Alcohol/week: 12.0 standard drinks of alcohol    Types: 12 Cans of beer per week    Comment: Occasionaly on weekends   Drug use: No   Social History   Socioeconomic History   Marital status: Single    Spouse name: Not on file   Number of children: Not on file   Years of education: Not on file   Highest education level: Not on file  Occupational History   Occupation: [employer]  Tobacco Use   Smoking status: Never   Smokeless tobacco: Never  Vaping Use   Vaping status: Never Used  Substance and Sexual Activity   Alcohol use: Yes    Alcohol/week: 12.0 standard drinks of alcohol    Types: 12 Cans of beer per week    Comment: Occasionaly on weekends   Drug use: No   Sexual activity: Yes    Partners: Female  Other Topics Concern   Not on file  Social History Narrative   Single. No children.   11th grade education.    Wears seatbelt   Regular diet.    Smoke detector in the home.    Feels safe in relationship.    No firearms in the home.    Social Drivers of Corporate investment banker Strain: Low Risk  (08/25/2023)   Overall Financial Resource Strain (CARDIA)    Difficulty of Paying Living Expenses: Not very hard  Food Insecurity: No Food Insecurity (08/25/2023)   Hunger Vital Sign    Worried About Running Out of Food in the Last Year: Never true    Ran Out of Food in the Last Year: Never true  Transportation Needs: No Transportation Needs (08/25/2023)   PRAPARE - Administrator, Civil Service (Medical): No    Lack of Transportation (Non-Medical): No  Physical Activity: Unknown (08/25/2023)   Exercise Vital Sign    Days of Exercise per Week: 7 days    Minutes of Exercise per Session: Patient unable to answer  Stress: Stress Concern Present (08/25/2023)   [employer] of Occupational Health - Occupational Stress Questionnaire    Feeling of Stress : Very much  Social Connections: Socially Isolated (08/25/2023)   Social Connection and  Isolation Panel    Frequency of Communication with Friends and Family: More than three times a week    Frequency of Social Gatherings with Friends and Family: More than three times a week    Attends Religious Services: Never    Database administrator or Organizations: No    Attends Banker Meetings: Never    Marital Status: Never married  Intimate Partner Violence: Not At Risk (08/25/2023)   Humiliation, Afraid, Rape, and Kick questionnaire    Fear of Current or Ex-Partner: No    Emotionally Abused: No    Physically Abused: No    Sexually Abused: No   Family Status  Relation Name Status   Mother  Alive   Father  Deceased   Sister  Alive   PGF  (Not Specified)   Mat Aunt  (Not Specified)  No partnership data on file   Family History  Problem Relation Age of Onset   Hypertension Mother    Alcohol abuse Father    Cirrhosis Father    Early death Father 37       cirrhosis    Cancer Sister        no details   Heart disease Paternal Grandfather    Hyperlipidemia Paternal Grandfather    Hypertension Paternal Grandfather    Cancer Maternal Aunt    Allergies  Allergen Reactions   Lipitor [Atorvastatin ] Other (See Comments)    mylagia   Shellfish-Derived Products Other (See Comments)    oysters      ROS Negative unless indicated in HPI   Objective:     BP (!) 156/80   Pulse 95   Temp 97.7 F (36.5 C) (Temporal)   Ht 5' 6 (1.676 m)   Wt 191 lb (86.6 kg)   SpO2 98%   BMI 30.83  kg/m  BP Readings from Last 3 Encounters:  03/08/24 (!) 156/80  12/17/23 (!) 178/98  12/01/23 (!) 149/88   Wt Readings from Last 3 Encounters:  03/08/24 191 lb (86.6 kg)  12/17/23 195 lb (88.5 kg)  12/01/23 194 lb 3.2 oz (88.1 kg)      Physical Exam Vitals and nursing note reviewed.  HENT:     Head: Normocephalic and atraumatic.     Nose: Nose normal.  Eyes:     Extraocular Movements: Extraocular movements intact.     Conjunctiva/sclera: Conjunctivae normal.      Pupils: Pupils are equal, round, and reactive to light.  Cardiovascular:     Heart sounds: Normal heart sounds.  Pulmonary:     Effort: Pulmonary effort is normal.     Breath sounds: Normal breath sounds.  Abdominal:     Palpations: Abdomen is soft.  Musculoskeletal:        General: Normal range of motion.     Right lower leg: No edema.     Left lower leg: No edema.  Skin:    General: Skin is warm and dry.     Findings: No rash.  Neurological:     Mental Status: He is alert and oriented to person, place, and time.  Psychiatric:        Mood and Affect: Mood normal.        Behavior: Behavior normal.        Thought Content: Thought content normal.        Judgment: Judgment normal.      Results for orders placed or performed in visit on 03/08/24  Bayer DCA Hb A1c Waived  Result Value Ref Range   HB A1C (BAYER DCA - WAIVED) 10.7 (H) 4.8 - 5.6 %    Last CBC Lab Results  Component Value Date   WBC 7.4 10/28/2023   HGB 16.0 10/28/2023   HCT 45.6 10/28/2023   MCV 85.9 10/28/2023   MCH 30.1 10/28/2023   RDW 12.1 10/28/2023   PLT 254 10/28/2023   Last metabolic panel Lab Results  Component Value Date   GLUCOSE 180 (H) 10/28/2023   NA 132 (L) 10/28/2023   K 4.0 10/28/2023   CL 95 (L) 10/28/2023   CO2 25 10/28/2023   BUN 14 10/28/2023   CREATININE 0.70 10/28/2023   GFRNONAA >60 10/28/2023   CALCIUM  10.1 10/28/2023   PROT 8.4 (H) 10/28/2023   ALBUMIN 4.8 10/28/2023   LABGLOB 2.7 08/25/2023   BILITOT 0.6 10/28/2023   ALKPHOS 56 10/28/2023   AST 20 10/28/2023   ALT 29 10/28/2023   ANIONGAP 12 10/28/2023   Last lipids Lab Results  Component Value Date   CHOL 266 (H) 08/25/2023   HDL 43 08/25/2023   LDLCALC 172 (H) 08/25/2023   LDLDIRECT 167.0 10/03/2017   TRIG 271 (H) 08/25/2023   CHOLHDL 6.2 (H) 08/25/2023   Last hemoglobin A1c Lab Results  Component Value Date   HGBA1C 10.7 (H) 03/08/2024   Last thyroid  functions Lab Results  Component Value Date    TSH 1.190 08/25/2023   T4TOTAL 7.8 08/25/2023        Assessment & Plan:  diabetes type 2 -     Bayer DCA Hb A1c Waived  Essential hypertension, benign -     Losartan  Potassium-HCTZ; Take 1 tablet by mouth daily.  Dispense: 90 tablet; Refill: 0 -     amLODIPine  Besylate; Take 1 tablet (10 mg total) by mouth daily.  Dispense: 90 tablet;  Refill: 0  Hyperlipidemia, unspecified hyperlipidemia type  Anxiety and depression  Other orders -     Semaglutide  (1 MG/DOSE); Inject 1 mg as directed once a week.  Dispense: 9 mL; Refill: 0   Stephen Lucero is a 40 year old Caucasian male seen today for chronic disease management, no acute distress  Hyperlipidemia: Continue Crestor  20 mg daily no refill needed  Diabetes: Not well-controlled with current medication increase Ozempic  to 1 mg every 7 days and continue metformin  750 mg twice daily client needs to work on diet and exercise decrease intake of carbs and fried food  Anxiety/depression: Declined counseling no medication dispense  Hypertension:BP not controlled. Changes DC HCTZ and lisinopril  made in regimen continue amlodipine  10 mg daily and start Hyzaar 100-25 mg daily, follow-up in 3 weeks for BP recheck. Goal BP is 130/80. Pt aware to report any persistent high or low readings. DASH diet and exercise encouraged. Exercise at least 150 minutes per week and increase as tolerated. Goal BMI > 25. Stress management encouraged. Avoid nicotine and tobacco product use. Avoid excessive alcohol and NSAID's. Avoid more than 2000 mg of sodium daily. Medications as prescribed. Follow up as scheduled.   Return in about 3 weeks (around 03/29/2024) for BP check/hypertension.    Amberlee Garvey [place] Thompson, DNP [facility] [address] [place], [place] [address] [phone]    Note: This document was prepared by [employer] voice dictation technology and any errors that results from this process are unintentional.

## 2024-03-08 ENCOUNTER — Ambulatory Visit: Payer: Self-pay | Admitting: Nurse Practitioner

## 2024-03-08 ENCOUNTER — Encounter: Payer: Self-pay | Admitting: Nurse Practitioner

## 2024-03-08 ENCOUNTER — Ambulatory Visit (INDEPENDENT_AMBULATORY_CARE_PROVIDER_SITE_OTHER): Admitting: Nurse Practitioner

## 2024-03-08 VITALS — BP 156/80 | HR 95 | Temp 97.7°F | Ht 66.0 in | Wt 191.0 lb

## 2024-03-08 DIAGNOSIS — E119 Type 2 diabetes mellitus without complications: Secondary | ICD-10-CM

## 2024-03-08 DIAGNOSIS — F419 Anxiety disorder, unspecified: Secondary | ICD-10-CM | POA: Diagnosis not present

## 2024-03-08 DIAGNOSIS — Z7985 Long-term (current) use of injectable non-insulin antidiabetic drugs: Secondary | ICD-10-CM

## 2024-03-08 DIAGNOSIS — F32A Depression, unspecified: Secondary | ICD-10-CM

## 2024-03-08 DIAGNOSIS — I10 Essential (primary) hypertension: Secondary | ICD-10-CM

## 2024-03-08 DIAGNOSIS — E785 Hyperlipidemia, unspecified: Secondary | ICD-10-CM | POA: Diagnosis not present

## 2024-03-08 LAB — BAYER DCA HB A1C WAIVED: HB A1C (BAYER DCA - WAIVED): 10.7 % — ABNORMAL HIGH (ref 4.8–5.6)

## 2024-03-08 MED ORDER — AMLODIPINE BESYLATE 10 MG PO TABS: 10.0000 mg | ORAL_TABLET | Freq: Every day | ORAL | 0 refills | Status: DC

## 2024-03-08 MED ORDER — SEMAGLUTIDE (1 MG/DOSE) 4 MG/3ML ~~LOC~~ SOPN: 1.0000 mg | PEN_INJECTOR | SUBCUTANEOUS | 0 refills | Status: AC

## 2024-03-08 MED ORDER — LOSARTAN POTASSIUM-HCTZ 100-25 MG PO TABS: 1.0000 | ORAL_TABLET | Freq: Every day | ORAL | 0 refills | Status: DC

## 2024-03-30 ENCOUNTER — Encounter: Payer: Self-pay | Admitting: Nurse Practitioner

## 2024-03-30 ENCOUNTER — Ambulatory Visit: Admitting: Nurse Practitioner

## 2024-03-30 ENCOUNTER — Telehealth: Payer: Self-pay

## 2024-03-30 NOTE — Telephone Encounter (Signed)
 Copied from CRM [id number]. Topic: Appointments - Appointment Info/Confirmation >> Mar 30, 2024  7:57 AM Donna BRAVO wrote: Patient/patient representative is calling for information regarding an appointment.  Patient calling stating he will not make it to his appt this morning, leaving town this morning.   Patient taking new BP medication, it makes his chest hurt and dizzy and light headed. He had some pills from previous prescription lisinopril  (PRINIVIL ,ZESTRIL ) 20 MG tablet  that he is now taking.  Patient would like it sent to: [id number] - [place], [place] - [address] [place] [address] Phone: [phone] Fax: [phone] >> Mar 30, 2024  8:11 AM Donna BRAVO wrote: Patient refused to speak with nurse triage

## 2024-04-01 ENCOUNTER — Other Ambulatory Visit (HOSPITAL_COMMUNITY): Payer: Self-pay

## 2024-04-01 NOTE — Progress Notes (Signed)
 Remain here    Subjective:  Patient ID: Stephen Lucero, male    DOB: [birth year], 58 y.o.   MRN: [id number]  Patient Care Team: Deitra Morton Hummer, Nena, NP as PCP - General (Nurse Practitioner) Lavona Agent, MD as PCP - Cardiology (Cardiology)   Chief Complaint:  Medical Management of Chronic Issues (F/u b/p)   HPI: Stephen Lucero is a 58 y.o. male presenting on 04/05/2024 for Medical Management of Chronic Issues (F/u b/p)   Discussed the use of AI scribe software for clinical note transcription with the patient, who gave verbal consent to proceed.  History of Present Illness Stephen Lucero is a 58 year old male with hypertension who presents for a three-week follow-up.  He stopped taking a medication in a white bottle after three days due to experiencing chest pain, dizziness, and headaches. He believes this medication was carvedilol. After discontinuing it, he resumed taking lisinopril , which he had been prescribed initially.  He is currently taking lisinopril  20 mg and amlodipine  10 mg. For the last two weeks, he has been taking two lisinopril  tablets daily. He has been working on his diet by eating more salads and fish, reducing sodium intake, and increasing water consumption.  He feels great. No recent head injuries.      Relevant past medical, surgical, family, and social history reviewed and updated as indicated.  Allergies and medications reviewed and updated. Data reviewed: Chart in Epic.   Past Medical History:  Diagnosis Date   Allergy    Diabetes mellitus without complication (HCC)    GERD (gastroesophageal reflux disease)    High cholesterol    Hypercalcemia 10/06/2017   Hypertension     Past Surgical History:  Procedure Laterality Date   CARDIAC CATHETERIZATION      Social History   Socioeconomic History   Marital status: Single    Spouse name: Not on file   Number of children: Not on file   Years of education: Not on file   Highest education level:  Not on file  Occupational History   Occupation: restore [employer]  Tobacco Use   Smoking status: Never   Smokeless tobacco: Never  Vaping Use   Vaping status: Never Used  Substance and Sexual Activity   Alcohol use: Yes    Alcohol/week: 12.0 standard drinks of alcohol    Types: 12 Cans of beer per week    Comment: Occasionaly on weekends   Drug use: No   Sexual activity: Yes    Partners: Female  Other Topics Concern   Not on file  Social History Narrative   Single. No children.   11th grade education.    Wears seatbelt   Regular diet.    Smoke detector in the home.    Feels safe in relationship.    No firearms in the home.    Social Drivers of Corporate investment banker Strain: Low Risk  (08/25/2023)   Overall Financial Resource Strain (CARDIA)    Difficulty of Paying Living Expenses: Not very hard  Food Insecurity: No Food Insecurity (08/25/2023)   Hunger Vital Sign    Worried About Running Out of Food in the Last Year: Never true    Ran Out of Food in the Last Year: Never true  Transportation Needs: No Transportation Needs (08/25/2023)   PRAPARE - Administrator, Civil Service (Medical): No    Lack of Transportation (Non-Medical): No  Physical Activity: Unknown (08/25/2023)   Exercise Vital Sign  Days of Exercise per Week: 7 days    Minutes of Exercise per Session: Patient unable to answer  Stress: Stress Concern Present (08/25/2023)   [employer] of Occupational Health - Occupational Stress Questionnaire    Feeling of Stress : Very much  Social Connections: Socially Isolated (08/25/2023)   Social Connection and Isolation Panel    Frequency of Communication with Friends and Family: More than three times a week    Frequency of Social Gatherings with Friends and Family: More than three times a week    Attends Religious Services: Never    Database administrator or Organizations: No    Attends Banker Meetings: Never    Marital Status:  Never married  Intimate Partner Violence: Not At Risk (08/25/2023)   Humiliation, Afraid, Rape, and Kick questionnaire    Fear of Current or Ex-Partner: No    Emotionally Abused: No    Physically Abused: No    Sexually Abused: No    Outpatient Encounter Medications as of 04/05/2024  Medication Sig   Accu-Chek FastClix Lancets MISC Check BS in the morning and at bedtime Dx E11.9   amLODipine  (NORVASC ) 10 MG tablet Take 1 tablet (10 mg total) by mouth daily.   aspirin  EC 81 MG tablet Take 1 tablet (81 mg total) by mouth daily. Swallow whole.   Blood Glucose Monitoring Suppl (ACCU-CHEK GUIDE ME) w/Device KIT Check BS in the morning and at bedtime Dx E11.9   EPINEPHrine  0.3 mg/0.3 mL IJ SOAJ injection Inject 0.3 mg into the muscle as needed for anaphylaxis.   erythromycin ophthalmic ointment SMARTSIG:1 Inch(es) In Eye(s) Every Night   famotidine  (PEPCID ) 20 MG tablet Take by mouth.   fenofibrate  (TRICOR ) 48 MG tablet TAKE 1 TABLET BY MOUTH EVERY DAY   glucose blood (ACCU-CHEK GUIDE TEST) test strip Check BS in the morning and at bedtime Dx E11.9   Lancets Misc. (ACCU-CHEK SOFTCLIX LANCET DEV) KIT Check BS in the morning and at bedtime Dx E11.9   lisinopril  (ZESTRIL ) 20 MG tablet Take 1 tablet (20 mg total) by mouth daily.   loratadine  (CLARITIN ) 10 MG tablet Take 1 tablet (10 mg total) by mouth daily.   metFORMIN  (GLUCOPHAGE -XR) 500 MG 24 hr tablet Take 2 tablets (1,000 mg total) by mouth 2 (two) times daily with a meal.   nitroGLYCERIN  (NITROSTAT ) 0.4 MG SL tablet Place 1 tablet (0.4 mg total) under the tongue every 5 (five) minutes as needed for chest pain. Reported on 11/27/2015   rosuvastatin  (CRESTOR ) 20 MG tablet Take 1 tablet (20 mg total) by mouth daily.   Semaglutide , 1 MG/DOSE, 4 MG/3ML SOPN Inject 1 mg as directed once a week.   tobramycin (TOBREX) 0.3 % ophthalmic solution Place 1 drop into the left eye 4 (four) times daily.   [DISCONTINUED] losartan -hydrochlorothiazide  (HYZAAR)  100-25 MG tablet Take 1 tablet by mouth daily.   [DISCONTINUED] losartan -hydrochlorothiazide  (HYZAAR) 100-25 MG tablet TAKE 1 TABLET BY MOUTH EVERY DAY   No facility-administered encounter medications on file as of 04/05/2024.    Allergies  Allergen Reactions   Lipitor [Atorvastatin ] Other (See Comments)    mylagia   Shellfish-Derived Products Other (See Comments)    oysters    Pertinent ROS per HPI, otherwise unremarkable      Objective:  BP 125/77   Pulse 100   Temp 97.7 F (36.5 C) (Temporal)   Ht 5' 6 (1.676 m)   Wt 188 lb 12.8 oz (85.6 kg)   SpO2 97%  BMI 30.47 kg/m    Wt Readings from Last 3 Encounters:  04/05/24 188 lb 12.8 oz (85.6 kg)  03/08/24 191 lb (86.6 kg)  12/17/23 195 lb (88.5 kg)   BP Readings from Last 3 Encounters:  04/05/24 125/77  03/08/24 (!) 156/80  12/17/23 (!) 178/98    Physical Exam Vitals and nursing note reviewed.  Constitutional:      General: He is not in acute distress. HENT:     Head: Normocephalic.     Nose: Nose normal.     Mouth/Throat:     Mouth: Mucous membranes are moist.  Eyes:     Extraocular Movements: Extraocular movements intact.     Conjunctiva/sclera: Conjunctivae normal.     Pupils: Pupils are equal, round, and reactive to light.  Cardiovascular:     Heart sounds: Normal heart sounds.  Pulmonary:     Effort: Pulmonary effort is normal.     Breath sounds: Normal breath sounds.  Musculoskeletal:        General: Normal range of motion.     Right lower leg: No edema.     Left lower leg: No edema.  Skin:    General: Skin is warm and dry.  Neurological:     Mental Status: He is alert and oriented to person, place, and time.  Psychiatric:        Mood and Affect: Mood normal.        Behavior: Behavior normal.        Thought Content: Thought content normal.        Judgment: Judgment normal.    Physical Exam VITALS: BP- 125/77     Results for orders placed or performed in visit on 03/08/24  Bayer DCA Hb  A1c Waived   Collection Time: 03/08/24 12:23 PM  Result Value Ref Range   HB A1C (BAYER DCA - WAIVED) 10.7 (H) 4.8 - 5.6 %       Pertinent labs & imaging results that were available during my care of the patient were reviewed by me and considered in my medical decision making.  Assessment & Plan:  Sorin was seen today for medical management of chronic issues.  Diagnoses and all orders for this visit:  Essential hypertension, benign -     lisinopril  (ZESTRIL ) 20 MG tablet; Take 1 tablet (20 mg total) by mouth daily.    Delon is a 58 year old Caucasian male seen today for hypertension management, no acute distress Assessment and Plan Assessment & Plan Essential hypertension Hypertension improving with current regimen. Blood pressure decreased to 125/77 mmHg. Lisinopril  and amlodipine  effective. Hyzaar discontinued due to side effects. - Continue lisinopril  20 mg daily. - Continue amlodipine  10 mg daily. - Discontinue Hyzaar  - Encourage continued dietary modifications. - Provide medication refills as needed.      Continue all other maintenance medications.  Follow up plan: Return in about 3 months (around 07/05/2024) for Chronic disease management.   Continue healthy lifestyle choices, including diet (rich in fruits, vegetables, and lean proteins, and low in salt and simple carbohydrates) and exercise (at least 30 minutes of moderate physical activity daily).  Educational handout given for  Hypertension, Adult Hypertension is another name for high blood pressure. High blood pressure forces your heart to work harder to pump blood. This can cause problems over time. There are two numbers in a blood pressure reading. There is a top number (systolic) over a bottom number (diastolic). It is best to have a blood pressure that is below  120/80. What are the causes? The cause of this condition is not known. Some other conditions can lead to high blood pressure. What increases  the risk? Some lifestyle factors can make you more likely to develop high blood pressure: Smoking. Not getting enough exercise or physical activity. Being overweight. Having too much fat, sugar, calories, or salt (sodium) in your diet. Drinking too much alcohol. Other risk factors include: Having any of these conditions: Heart disease. Diabetes. High cholesterol. Kidney disease. Obstructive sleep apnea. Having a family history of high blood pressure and high cholesterol. Age. The risk increases with age. Stress. What are the signs or symptoms? High blood pressure may not cause symptoms. Very high blood pressure (hypertensive crisis) may cause: Headache. Fast or uneven heartbeats (palpitations). Shortness of breath. Nosebleed. Vomiting or feeling like you may vomit (nauseous). Changes in how you see. Very bad chest pain. Feeling dizzy. Seizures. How is this treated? This condition is treated by making healthy lifestyle changes, such as: Eating healthy foods. Exercising more. Drinking less alcohol. Your doctor may prescribe medicine if lifestyle changes do not help enough and if: Your top number is above 130. Your bottom number is above 80. Your personal target blood pressure may vary. Follow these instructions at home: Eating and drinking  If told, follow the DASH eating plan. To follow this plan: Fill one half of your plate at each meal with fruits and vegetables. Fill one fourth of your plate at each meal with whole grains. Whole grains include whole-wheat pasta, brown rice, and whole-grain bread. Eat or drink low-fat dairy products, such as skim milk or low-fat yogurt. Fill one fourth of your plate at each meal with low-fat (lean) proteins. Low-fat proteins include fish, chicken without skin, eggs, beans, and tofu. Avoid fatty meat, cured and processed meat, or chicken with skin. Avoid pre-made or processed food. Limit the amount of salt in your diet to less than  1,500 mg each day. Do not drink alcohol if: Your doctor tells you not to drink. You are pregnant, may be pregnant, or are planning to become pregnant. If you drink alcohol: Limit how much you have to: 0-1 drink a day for women. 0-2 drinks a day for men. Know how much alcohol is in your drink. In the [place] one drink equals one 12 oz bottle of beer (355 mL), one 5 oz glass of wine (148 mL), or one 1 oz glass of hard liquor (44 mL). Lifestyle  Work with your doctor to stay at a healthy weight or to lose weight. Ask your doctor what the best weight is for you. Get at least 30 minutes of exercise that causes your heart to beat faster (aerobic exercise) most days of the week. This may include walking, swimming, or biking. Get at least 30 minutes of exercise that strengthens your muscles (resistance exercise) at least 3 days a week. This may include lifting weights or doing Pilates. Do not smoke or use any products that contain nicotine or tobacco. If you need help quitting, ask your doctor. Check your blood pressure at home as told by your doctor. Keep all follow-up visits. Medicines Take over-the-counter and prescription medicines only as told by your doctor. Follow directions carefully. Do not skip doses of blood pressure medicine. The medicine does not work as well if you skip doses. Skipping doses also puts you at risk for problems. Ask your doctor about side effects or reactions to medicines that you should watch for. Contact a doctor if: You think  you are having a reaction to the medicine you are taking. You have headaches that keep coming back. You feel dizzy. You have swelling in your ankles. You have trouble with your vision. Get help right away if: You get a very bad headache. You start to feel mixed up (confused). You feel weak or numb. You feel faint. You have very bad pain in your: Chest. Belly (abdomen). You vomit more than once. You have trouble breathing. These  symptoms may be an emergency. Get help right away. Call 911. Do not wait to see if the symptoms will go away. Do not drive yourself to the hospital. Summary Hypertension is another name for high blood pressure. High blood pressure forces your heart to work harder to pump blood. For most people, a normal blood pressure is less than 120/80. Making healthy choices can help lower blood pressure. If your blood pressure does not get lower with healthy choices, you may need to take medicine. This information is not intended to replace advice given to you by your health care provider. Make sure you discuss any questions you have with your health care provider. Document Revised: 04/19/2021 Document Reviewed: 04/19/2021 Elsevier Patient Education  2024 Elsevier Inc. Managing Your Hypertension Hypertension, also called high blood pressure, is when the force of the blood pressing against the walls of the arteries is too strong. Arteries are blood vessels that carry blood from your heart throughout your body. Hypertension forces the heart to work harder to pump blood and may cause the arteries to become narrow or stiff. Understanding blood pressure readings A blood pressure reading includes a higher number over a lower number: The first, or top, number is called the systolic pressure. It is a measure of the pressure in your arteries as your heart beats. The second, or bottom number, is called the diastolic pressure. It is a measure of the pressure in your arteries as the heart relaxes. For most people, a normal blood pressure is below 120/80. Your personal target blood pressure may vary depending on your medical conditions, your age, and other factors. Blood pressure is classified into four stages. Based on your blood pressure reading, your health care provider may use the following stages to determine what type of treatment you need, if any. Systolic pressure and diastolic pressure are measured in a unit  called millimeters of mercury (mmHg). Normal Systolic pressure: below 120. Diastolic pressure: below 80. Elevated Systolic pressure: 120-129. Diastolic pressure: below 80. Hypertension stage 1 Systolic pressure: 130-139. Diastolic pressure: 80-89. Hypertension stage 2 Systolic pressure: 140 or above. Diastolic pressure: 90 or above. How can this condition affect me? Managing your hypertension is very important. Over time, hypertension can damage the arteries and decrease blood flow to parts of the body, including the brain, heart, and kidneys. Having untreated or uncontrolled hypertension can lead to: A heart attack. A stroke. A weakened blood vessel (aneurysm). Heart failure. Kidney damage. Eye damage. Memory and concentration problems. Vascular dementia. What actions can I take to manage this condition? Hypertension can be managed by making lifestyle changes and possibly by taking medicines. Your health care provider will help you make a plan to bring your blood pressure within a normal range. You may be referred for counseling on a healthy diet and physical activity. Nutrition  Eat a diet that is high in fiber and potassium, and low in salt (sodium), added sugar, and fat. An example eating plan is called the DASH diet. DASH stands for Dietary Approaches to Stop  Hypertension. To eat this way: Eat plenty of fresh fruits and vegetables. Try to fill one-half of your plate at each meal with fruits and vegetables. Eat whole grains, such as whole-wheat pasta, brown rice, or whole-grain bread. Fill about one-fourth of your plate with whole grains. Eat low-fat dairy products. Avoid fatty cuts of meat, processed or cured meats, and poultry with skin. Fill about one-fourth of your plate with lean proteins such as fish, chicken without skin, beans, eggs, and tofu. Avoid pre-made and processed foods. These tend to be higher in sodium, added sugar, and fat. Reduce your daily sodium intake. Many  people with hypertension should eat less than 1,500 mg of sodium a day. Lifestyle  Work with your health care provider to maintain a healthy body weight or to lose weight. Ask what an ideal weight is for you. Get at least 30 minutes of exercise that causes your heart to beat faster (aerobic exercise) most days of the week. Activities may include walking, swimming, or biking. Include exercise to strengthen your muscles (resistance exercise), such as weight lifting, as part of your weekly exercise routine. Try to do these types of exercises for 30 minutes at least 3 days a week. Do not use any products that contain nicotine or tobacco. These products include cigarettes, chewing tobacco, and vaping devices, such as e-cigarettes. If you need help quitting, ask your health care provider. Control any long-term (chronic) conditions you have, such as high cholesterol or diabetes. Identify your sources of stress and find ways to manage stress. This may include meditation, deep breathing, or making time for fun activities. Alcohol use Do not drink alcohol if: Your health care provider tells you not to drink. You are pregnant, may be pregnant, or are planning to become pregnant. If you drink alcohol: Limit how much you have to: 0-1 drink a day for women. 0-2 drinks a day for men. Know how much alcohol is in your drink. In the [place] one drink equals one 12 oz bottle of beer (355 mL), one 5 oz glass of wine (148 mL), or one 1 oz glass of hard liquor (44 mL). Medicines Your health care provider may prescribe medicine if lifestyle changes are not enough to get your blood pressure under control and if: Your systolic blood pressure is 130 or higher. Your diastolic blood pressure is 80 or higher. Take medicines only as told by your health care provider. Follow the directions carefully. Blood pressure medicines must be taken as told by your health care provider. The medicine does not work as well when you skip  doses. Skipping doses also puts you at risk for problems. Monitoring Before you monitor your blood pressure: Do not smoke, drink caffeinated beverages, or exercise within 30 minutes before taking a measurement. Use the bathroom and empty your bladder (urinate). Sit quietly for at least 5 minutes before taking measurements. Monitor your blood pressure at home as told by your health care provider. To do this: Sit with your back straight and supported. Place your feet flat on the floor. Do not cross your legs. Support your arm on a flat surface, such as a table. Make sure your upper arm is at heart level. Each time you measure, take two or three readings one minute apart and record the results. You may also need to have your blood pressure checked regularly by your health care provider. General information Talk with your health care provider about your diet, exercise habits, and other lifestyle factors that may be  contributing to hypertension. Review all the medicines you take with your health care provider because there may be side effects or interactions. Keep all follow-up visits. Your health care provider can help you create and adjust your plan for managing your high blood pressure. Where to find more information National Heart, Lung, and Blood Institute: PopSteam.is American Heart Association: www.heart.org Contact a health care provider if: You think you are having a reaction to medicines you have taken. You have repeated (recurrent) headaches. You feel dizzy. You have swelling in your ankles. You have trouble with your vision. Get help right away if: You develop a severe headache or confusion. You have unusual weakness or numbness, or you feel faint. You have severe pain in your chest or abdomen. You vomit repeatedly. You have trouble breathing. These symptoms may be an emergency. Get help right away. Call 911. Do not wait to see if the symptoms will go away. Do not drive  yourself to the hospital. Summary Hypertension is when the force of blood pumping through your arteries is too strong. If this condition is not controlled, it may put you at risk for serious complications. Your personal target blood pressure may vary depending on your medical conditions, your age, and other factors. For most people, a normal blood pressure is less than 120/80. Hypertension is managed by lifestyle changes, medicines, or both. Lifestyle changes to help manage hypertension include losing weight, eating a healthy, low-sodium diet, exercising more, stopping smoking, and limiting alcohol. This information is not intended to replace advice given to you by your health care provider. Make sure you discuss any questions you have with your health care provider. Document Revised: 03/15/2021 Document Reviewed: 03/15/2021 Elsevier Patient Education  2024 Elsevier Inc.  The above assessment and management plan was discussed with the patient. The patient verbalized understanding of and has agreed to the management plan. Patient is aware to call the clinic if they develop any new symptoms or if symptoms persist or worsen. Patient is aware when to return to the clinic for a follow-up visit. Patient educated on when it is appropriate to go to the emergency department.  Hurley Sobel [place] Thompson, DNP [facility] [address] [place], [place] [address] [phone]

## 2024-04-02 ENCOUNTER — Other Ambulatory Visit (HOSPITAL_COMMUNITY): Payer: Self-pay

## 2024-04-03 ENCOUNTER — Other Ambulatory Visit: Payer: Self-pay | Admitting: Nurse Practitioner

## 2024-04-03 DIAGNOSIS — I10 Essential (primary) hypertension: Secondary | ICD-10-CM

## 2024-04-05 ENCOUNTER — Other Ambulatory Visit (HOSPITAL_COMMUNITY): Payer: Self-pay

## 2024-04-05 ENCOUNTER — Telehealth: Payer: Self-pay

## 2024-04-05 ENCOUNTER — Encounter: Payer: Self-pay | Admitting: Nurse Practitioner

## 2024-04-05 ENCOUNTER — Ambulatory Visit: Admitting: Nurse Practitioner

## 2024-04-05 VITALS — BP 125/77 | HR 100 | Temp 97.7°F | Ht 66.0 in | Wt 188.8 lb

## 2024-04-05 DIAGNOSIS — I10 Essential (primary) hypertension: Secondary | ICD-10-CM | POA: Diagnosis not present

## 2024-04-05 MED ORDER — LISINOPRIL 20 MG PO TABS: 20.0000 mg | ORAL_TABLET | Freq: Every day | ORAL | 0 refills | Status: DC

## 2024-04-05 NOTE — Telephone Encounter (Signed)
 Pharmacy Patient Advocate Encounter   Received notification from CoverMyMeds that prior authorization for Ozempic  (1 MG/DOSE) 4MG /3ML pen-injectors  is required/requested.   Insurance verification completed.   The patient is insured through [employer] .   Per test claim: PA required and submitted KEY/EOC/Request #: [id number] by the processor we sent it to: Resubmitted through Fax to [facility]    Eligibility could not be verified in Latent. Called insurance at [phone], representative faxing PA form. Will complete and fax back once received.

## 2024-04-05 NOTE — Telephone Encounter (Signed)
 Additional information has been requested from the patient's insurance in order to proceed with the prior authorization request. Requested information has been sent, or form has been filled out and faxed back to [phone]

## 2024-04-06 NOTE — Telephone Encounter (Signed)
NA / VM not set up 

## 2024-04-06 NOTE — Telephone Encounter (Signed)
 Pharmacy Patient Advocate Encounter  Received notification from [facility] that Prior Authorization for  Ozempic  (1 MG/DOSE) 4MG /3ML pen-injector  has been APPROVED from 04/06/24 to 04/06/25   PA #/Case ID/Reference #: [id number]

## 2024-04-07 ENCOUNTER — Other Ambulatory Visit (HOSPITAL_COMMUNITY): Payer: Self-pay

## 2024-04-13 NOTE — Telephone Encounter (Signed)
 2nd attempt to contact patient. Just wanted to make sure he was aware that the Prior Auth for his Ozempic  Rx was approved through his insurance. No answer and no option to leave a message.

## 2024-07-05 ENCOUNTER — Other Ambulatory Visit: Payer: Self-pay | Admitting: Nurse Practitioner

## 2024-07-05 ENCOUNTER — Ambulatory Visit: Payer: Self-pay | Admitting: Nurse Practitioner

## 2024-07-05 DIAGNOSIS — I10 Essential (primary) hypertension: Secondary | ICD-10-CM

## 2024-07-05 NOTE — Telephone Encounter (Signed)
 Copied from CRM [id number]. Topic: Clinical - Medication Refill >> Jul 05, 2024  9:37 AM Rosaria A wrote: Medication: Patient states that he is needing his blood pressure medication and cholesterol medication filled. He does not have the bottles with him due to him being at work.   Has the patient contacted their pharmacy? Yes   This is the patient's preferred pharmacy:  [id number] - [place], [place] - [address] [place] [address] Phone: [phone] Fax: [phone]  Is this the correct pharmacy for this prescription? Yes   Has the prescription been filled recently? No  Is the patient out of the medication? Yes  Has the patient been seen for an appointment in the last year OR does the patient have an upcoming appointment? Yes  Can we respond through MyChart? No  Agent: Please be advised that Rx refills may take up to 3 business days. We ask that you follow-up with your pharmacy.

## 2024-07-07 MED ORDER — AMLODIPINE BESYLATE 10 MG PO TABS: 10.0000 mg | ORAL_TABLET | Freq: Every day | ORAL | 0 refills | Status: AC

## 2024-07-07 MED ORDER — LISINOPRIL 20 MG PO TABS: 20.0000 mg | ORAL_TABLET | Freq: Every day | ORAL | 0 refills | Status: AC

## 2024-07-26 ENCOUNTER — Encounter: Payer: Self-pay | Admitting: *Deleted

## 2024-08-04 ENCOUNTER — Encounter: Payer: Self-pay | Admitting: Nurse Practitioner

## 2024-08-04 ENCOUNTER — Ambulatory Visit: Admitting: Nurse Practitioner

## 2024-08-05 ENCOUNTER — Ambulatory Visit: Admitting: Nurse Practitioner

## 2024-08-06 ENCOUNTER — Ambulatory Visit: Payer: Self-pay
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
## 2024-08-06 NOTE — Telephone Encounter (Signed)
Fyi noted.

## 2024-08-09 ENCOUNTER — Emergency Department (HOSPITAL_BASED_OUTPATIENT_CLINIC_OR_DEPARTMENT_OTHER)
Admission: EM | Admit: 2024-08-09 | Discharge: 2024-08-09 | Disposition: A | Discharge status: Home / Self Care | Attending: Emergency Medicine | Admitting: Emergency Medicine

## 2024-08-09 ENCOUNTER — Other Ambulatory Visit: Payer: Self-pay

## 2024-08-09 ENCOUNTER — Emergency Department (HOSPITAL_BASED_OUTPATIENT_CLINIC_OR_DEPARTMENT_OTHER)

## 2024-08-09 ENCOUNTER — Emergency Department (HOSPITAL_BASED_OUTPATIENT_CLINIC_OR_DEPARTMENT_OTHER): Admitting: Radiology

## 2024-08-09 DIAGNOSIS — I10 Essential (primary) hypertension: Secondary | ICD-10-CM | POA: Insufficient documentation

## 2024-08-09 DIAGNOSIS — E878 Other disorders of electrolyte and fluid balance, not elsewhere classified: Secondary | ICD-10-CM | POA: Diagnosis not present

## 2024-08-09 DIAGNOSIS — E1165 Type 2 diabetes mellitus with hyperglycemia: Secondary | ICD-10-CM | POA: Insufficient documentation

## 2024-08-09 DIAGNOSIS — I672 Cerebral atherosclerosis: Secondary | ICD-10-CM | POA: Insufficient documentation

## 2024-08-09 DIAGNOSIS — R29810 Facial weakness: Secondary | ICD-10-CM

## 2024-08-09 DIAGNOSIS — Z7982 Long term (current) use of aspirin: Secondary | ICD-10-CM | POA: Insufficient documentation

## 2024-08-09 DIAGNOSIS — G510 Bell's palsy: Secondary | ICD-10-CM | POA: Diagnosis not present

## 2024-08-09 DIAGNOSIS — E871 Hypo-osmolality and hyponatremia: Secondary | ICD-10-CM | POA: Diagnosis not present

## 2024-08-09 DIAGNOSIS — R739 Hyperglycemia, unspecified: Secondary | ICD-10-CM

## 2024-08-09 DIAGNOSIS — R519 Headache, unspecified: Secondary | ICD-10-CM | POA: Diagnosis present

## 2024-08-09 LAB — CBG MONITORING, ED: Glucose-Capillary: 327 mg/dL — ABNORMAL HIGH (ref 70–99)

## 2024-08-09 LAB — COMPREHENSIVE METABOLIC PANEL WITH GFR
ALT: 34 U/L (ref 0–44)
AST: 20 U/L (ref 15–41)
Albumin: 4.7 g/dL (ref 3.5–5.0)
Alkaline Phosphatase: 57 U/L (ref 38–126)
Anion gap: 14 (ref 5–15)
BUN: 13 mg/dL (ref 6–20)
CO2: 25 mmol/L (ref 22–32)
Calcium: 10 mg/dL (ref 8.9–10.3)
Chloride: 93 mmol/L — ABNORMAL LOW (ref 98–111)
Creatinine, Ser: 0.85 mg/dL (ref 0.61–1.24)
GFR, Estimated: >60 mL/min
Glucose, Bld: 355 mg/dL — ABNORMAL HIGH (ref 70–99)
Potassium: 4.7 mmol/L (ref 3.5–5.1)
Sodium: 132 mmol/L — ABNORMAL LOW (ref 135–145)
Total Bilirubin: 0.4 mg/dL (ref 0.0–1.2)
Total Protein: 7.7 g/dL (ref 6.5–8.1)

## 2024-08-09 LAB — CBC
HCT: 44.2 % (ref 39.0–52.0)
Hemoglobin: 15.6 g/dL (ref 13.0–17.0)
MCH: 30.6 pg (ref 26.0–34.0)
MCHC: 35.3 g/dL (ref 30.0–36.0)
MCV: 86.7 fL (ref 80.0–100.0)
Platelets: 233 10*3/uL (ref 150–400)
RBC: 5.1 MIL/uL (ref 4.22–5.81)
RDW: 11.7 % (ref 11.5–15.5)
WBC: 6.2 10*3/uL (ref 4.0–10.5)
nRBC: 0 % (ref 0.0–0.2)

## 2024-08-09 LAB — DIFFERENTIAL
Abs Immature Granulocytes: 0.02 10*3/uL (ref 0.00–0.07)
Basophils Absolute: 0 10*3/uL (ref 0.0–0.1)
Basophils Relative: 1 %
Eosinophils Absolute: 0.1 10*3/uL (ref 0.0–0.5)
Eosinophils Relative: 2 %
Immature Granulocytes: 0 %
Lymphocytes Relative: 30 %
Lymphs Abs: 1.9 10*3/uL (ref 0.7–4.0)
Monocytes Absolute: 0.5 10*3/uL (ref 0.1–1.0)
Monocytes Relative: 8 %
Neutro Abs: 3.6 10*3/uL (ref 1.7–7.7)
Neutrophils Relative %: 59 %

## 2024-08-09 LAB — PROTIME-INR
INR: 0.9 (ref 0.8–1.2)
Prothrombin Time: 12.6 s (ref 11.4–15.2)

## 2024-08-09 LAB — APTT: aPTT: 29 s (ref 24–36)

## 2024-08-09 LAB — ETHANOL: Alcohol, Ethyl (B): <15 mg/dL

## 2024-08-09 MED ORDER — IOHEXOL 350 MG/ML SOLN
75.0000 mL | Freq: Once | INTRAVENOUS | Status: AC | PRN
  Administered 2024-08-09: 75 mL via INTRAVENOUS

## 2024-08-09 MED ORDER — VALACYCLOVIR HCL 1 G PO TABS: 1000.0000 mg | ORAL_TABLET | Freq: Three times a day (TID) | ORAL | 0 refills | Status: AC

## 2024-08-09 MED ORDER — ASPIRIN 81 MG PO CHEW
324.0000 mg | CHEWABLE_TABLET | Freq: Once | ORAL | Status: AC
  Administered 2024-08-09: 324 mg via ORAL
  Filled 2024-08-09: qty 4

## 2024-08-09 MED ORDER — ASPIRIN 325 MG PO TBEC: 325.0000 mg | DELAYED_RELEASE_TABLET | Freq: Every day | ORAL | 1 refills | Status: AC

## 2024-08-09 MED ORDER — HYPROMELLOSE (GONIOSCOPIC) 2.5 % OP SOLN: 1.0000 [drp] | OPHTHALMIC | 12 refills | Status: AC | PRN

## 2024-08-09 MED ORDER — VALACYCLOVIR HCL 500 MG PO TABS
1000.0000 mg | ORAL_TABLET | Freq: Once | ORAL | Status: AC
  Administered 2024-08-09: 1000 mg via ORAL
  Filled 2024-08-09: qty 2

## 2024-08-09 MED ORDER — ARTIFICIAL TEARS OPHTHALMIC OINT
TOPICAL_OINTMENT | OPHTHALMIC | Status: DC | PRN
  Administered 2024-08-09: 1 via OPHTHALMIC
  Filled 2024-08-09: qty 3.5

## 2024-08-09 NOTE — ED Triage Notes (Signed)
 Arrives ambulatory to the ED with complaints of worsening left side facial droop x4 days with a posterior headache. No dizziness or extremity. He does report some blurred vision in his left eye when looking down
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
## 2024-08-09 NOTE — Discharge Instructions (Signed)
 Please read and follow all provided instructions.  Your diagnoses today include:  1. Facial weakness   2. Bell palsy   3. Hyperglycemia   4. Cerebral atherosclerosis     Tests performed today include:  Vital signs. See below for your results today.   Medications prescribed:   Take any prescribed medications only as directed.  Home care instructions:  Follow any educational materials contained in this packet.  BE VERY CAREFUL not to take multiple medicines containing Tylenol (also called acetaminophen). Doing so can lead to an overdose which can damage your liver and cause liver failure and possibly death.   Follow-up instructions: Please follow-up with your primary care provider in the next 3 days for further evaluation of your symptoms.   Return instructions:  Please return to the Emergency Department if you experience worsening symptoms.  Please return if you have any other emergent concerns.  Additional Information:  Your vital signs today were: BP (!) 160/92 (BP Location: Right Arm)   Pulse 89   Temp 98.4 F (36.9 C)   Resp 18   Ht 5' 6 (1.676 m)   Wt 87.3 kg   SpO2 97%   BMI 31.05 kg/m  If your blood pressure (BP) was elevated above 135/85 this visit, please have this repeated by your doctor within one month. --------------

## 2024-08-09 NOTE — ED Notes (Signed)
 DC paperwork given and verbally understood.

## 2025-08-08 ENCOUNTER — Ambulatory Visit: Admitting: Family Medicine
